# Patient Record
Sex: Male | Born: 1970 | Race: White | Hispanic: No | Marital: Married | Smoking: Never smoker
Health system: Southern US, Community
[De-identification: ages and names within clinical notes are randomized; demographics above are authoritative.]

## PROBLEM LIST (undated history)

## (undated) DIAGNOSIS — M1990 Unspecified osteoarthritis, unspecified site: Secondary | ICD-10-CM

## (undated) DIAGNOSIS — K222 Esophageal obstruction: Secondary | ICD-10-CM

## (undated) DIAGNOSIS — J329 Chronic sinusitis, unspecified: Secondary | ICD-10-CM

## (undated) DIAGNOSIS — Z8489 Family history of other specified conditions: Secondary | ICD-10-CM

## (undated) DIAGNOSIS — K5190 Ulcerative colitis, unspecified, without complications: Secondary | ICD-10-CM

## (undated) DIAGNOSIS — D839 Common variable immunodeficiency, unspecified: Secondary | ICD-10-CM

## (undated) HISTORY — DX: Unspecified osteoarthritis, unspecified site: M19.90

## (undated) HISTORY — DX: Ulcerative colitis, unspecified, without complications: K51.90

## (undated) HISTORY — DX: Common variable immunodeficiency, unspecified: D83.9

## (undated) HISTORY — DX: Chronic sinusitis, unspecified: J32.9

## (undated) HISTORY — DX: Esophageal obstruction: K22.2

---
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
## 1992-01-12 DIAGNOSIS — K5190 Ulcerative colitis, unspecified, without complications: Secondary | ICD-10-CM

## 1992-01-12 HISTORY — DX: Ulcerative colitis, unspecified, without complications: K51.90

## 2005-01-11 DIAGNOSIS — M1990 Unspecified osteoarthritis, unspecified site: Secondary | ICD-10-CM

## 2005-01-11 HISTORY — DX: Unspecified osteoarthritis, unspecified site: M19.90

## 2006-01-10 ENCOUNTER — Ambulatory Visit (HOSPITAL_COMMUNITY): Admission: RE | Admit: 2006-01-10 | Discharge: 2006-01-10 | Payer: Self-pay | Admitting: Family Medicine

## 2014-12-18 ENCOUNTER — Other Ambulatory Visit: Payer: Self-pay | Admitting: Internal Medicine

## 2014-12-18 DIAGNOSIS — D849 Immunodeficiency, unspecified: Secondary | ICD-10-CM

## 2014-12-18 DIAGNOSIS — M792 Neuralgia and neuritis, unspecified: Secondary | ICD-10-CM

## 2014-12-25 ENCOUNTER — Other Ambulatory Visit: Payer: Self-pay

## 2014-12-26 ENCOUNTER — Ambulatory Visit
Admission: RE | Admit: 2014-12-26 | Discharge: 2014-12-26 | Disposition: A | Discharge status: Home / Self Care | Payer: BLUE CROSS/BLUE SHIELD | Source: Ambulatory Visit | Attending: Internal Medicine | Admitting: Internal Medicine

## 2014-12-26 ENCOUNTER — Other Ambulatory Visit: Payer: Self-pay | Admitting: Internal Medicine

## 2014-12-26 DIAGNOSIS — D849 Immunodeficiency, unspecified: Secondary | ICD-10-CM

## 2014-12-26 DIAGNOSIS — M792 Neuralgia and neuritis, unspecified: Secondary | ICD-10-CM

## 2014-12-26 DIAGNOSIS — D899 Disorder involving the immune mechanism, unspecified: Principal | ICD-10-CM

## 2014-12-26 MED ORDER — GADOBENATE DIMEGLUMINE 529 MG/ML IV SOLN
16.0000 mL | Freq: Once | INTRAVENOUS | Status: AC | PRN
  Administered 2014-12-26: 16 mL via INTRAVENOUS

## 2015-01-21 ENCOUNTER — Ambulatory Visit: Payer: BLUE CROSS/BLUE SHIELD | Admitting: Diagnostic Neuroimaging

## 2015-01-22 ENCOUNTER — Encounter: Payer: Self-pay | Admitting: *Deleted

## 2015-01-22 ENCOUNTER — Ambulatory Visit (INDEPENDENT_AMBULATORY_CARE_PROVIDER_SITE_OTHER): Payer: BLUE CROSS/BLUE SHIELD | Admitting: Diagnostic Neuroimaging

## 2015-01-22 VITALS — BP 142/93 | HR 77 | Ht 69.0 in | Wt 170.2 lb

## 2015-01-22 DIAGNOSIS — D839 Common variable immunodeficiency, unspecified: Secondary | ICD-10-CM | POA: Diagnosis not present

## 2015-01-22 DIAGNOSIS — M5416 Radiculopathy, lumbar region: Secondary | ICD-10-CM | POA: Diagnosis not present

## 2015-01-22 DIAGNOSIS — M5414 Radiculopathy, thoracic region: Secondary | ICD-10-CM | POA: Diagnosis not present

## 2015-01-22 MED ORDER — AMITRIPTYLINE HCL 25 MG PO TABS: 25.0000 mg | ORAL_TABLET | Freq: Every day | ORAL | Status: DC

## 2015-01-22 NOTE — Patient Instructions (Signed)
Thank you for coming to see [place] at [facility]. I hope we have been able to provide you high quality care today.  You may receive a patient satisfaction survey over the next few weeks. We would appreciate your feedback and comments so that we may continue to improve ourselves and the health of our patients.  - try amitriptyline 56m at bedtime - try compounded neuropathy cream three times per day   ~~~~~~~~~~~~~~~~~~~~~~~~~~~~~~~~~~~~~~~~~~~~~~~~~~~~~~~~~~~~~~~~~  DR. PENUMALLI'S GUIDE TO HAPPY AND HEALTHY LIVING These are some of my general health and wellness recommendations. Some of them may apply to you better than others. Please use common sense as you try these suggestions and feel free to ask me any questions.   ACTIVITY/FITNESS Mental, social, emotional and physical stimulation are very important for brain and body health. Try learning a new activity (arts, music, language, sports, games).  Keep moving your body to the best of your abilities. You can do this at home, inside or outside, the park, community center, gym or anywhere you like. Consider a physical therapist or personal trainer to get started. Consider the app Sworkit. Fitness trackers such as smart-watches, smart-phones or Fitbits can help as well.   NUTRITION Eat more plants: colorful vegetables, nuts, seeds and berries.  Eat less sugar, salt, preservatives and processed foods.  Avoid toxins such as cigarettes and alcohol.  Drink water when you are thirsty. Warm water with a slice of lemon is an excellent morning drink to start the day.  Consider these websites for more information The Nutrition Source (hhttps://www.henry-hernandez.biz/ Precision Nutrition (wWindowBlog.ch   RELAXATION Consider practicing mindfulness meditation or other relaxation techniques such as deep breathing, prayer, yoga, tai chi, massage. See website mindful.org or the apps  Headspace or Calm to help get started.   SLEEP Try to get at least 7-8+ hours sleep per day. Regular exercise and reduced caffeine will help you sleep better. Practice good sleep hygeine techniques. See website sleep.org for more information.   PLANNING Prepare estate planning, living will, healthcare POA documents. Sometimes this is best planned with the help of an attorney. Theconversationproject.org and agingwithdignity.org are excellent resources.

## 2015-01-22 NOTE — Progress Notes (Signed)
[facility]  PATIENT: Eric Carson DOB: 10-12-1970  REFERRING CLINICIAN: Dione Housekeeper, MD HISTORY FROM: patient and wife  REASON FOR VISIT: new consult    HISTORICAL  CHIEF COMPLAINT:  Chief Complaint  Patient presents with  . Numbness    rm 7, New pt,  wife- Sharyn Lull, "waves of pins/needles right side" since 11/11/14    HISTORY OF PRESENT ILLNESS:   45 year old left-handed male with common variable immunodeficiency diagnosed age 65 years old, on IVIG therapy since age 59 years old, ankylosing spondylitis, ulcerative colitis, here for evaluation of right thoracic/flank numbness and right leg numbness.  11/11/2014 patient noticed onset of pain radiating from his mid thoracic region posteriorly, radiating circumferentially around his right flank and anteriorly into his right anterior costal margin. He describes a 1-2 inch band of pain, burning, pins and needles and numbness. Patient noted no specific rash or lesions. No prodromal trauma or accidents.  Patient went to PCP and was prescribed Valtrex. However the next day symptoms improved and therefore patient did not take Valtrex. Symptoms fluctuated over the next few days.  In November patient was prescribed course of prednisone. Symptoms improved slightly but continued.  Now patient continues to have intermittent waves of pressure, numbness and tingling sensation in the right thoracic dermatomal region, as well as radiating symptoms in the right leg.  Patient had MRI scans of the brain, thoracic and lumbar spine which were unremarkable.  Symptoms continue today. Symptoms have been aggravated by movement. Symptoms are affecting patient's quality of life and sleep and mood.    REVIEW OF SYSTEMS: Full 14 system review of systems performed and notable only for weight loss fatigue cough numbness not enough sleep running nose skin sensitivity insomnia.  ALLERGIES: Allergies  Allergen Reactions  . Penicillins  Hives    As child    HOME MEDICATIONS: No outpatient prescriptions prior to visit.   No facility-administered medications prior to visit.    PAST MEDICAL HISTORY: Past Medical History  Diagnosis Date  . CVID (common variable immunodeficiency) ([place]) age 40    tx at 65- IVIG since age 89  . Inflammatory arthritis ([place]) 2007  . Ulcerative colitis ([place]) 1994  . Sinusitis     recurrent    PAST SURGICAL HISTORY: Past Surgical History  Procedure Laterality Date  . Lymph node dissection  1983  . Wisdom tooth extraction  1989    FAMILY HISTORY: Family History  Problem Relation Age of Onset  . Lymphoma Mother   . Arthritis Mother   . Lupus Paternal Aunt   . Cancer - Colon Maternal Grandmother   . Alzheimer's disease Paternal Grandfather     SOCIAL HISTORY:  Social History   Social History  . Marital Status: Married    Spouse Name: Sharyn Lull  . Number of Children: 2  . Years of Education: 16   Occupational History  .      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 Smoking status: Never Smoker   . Smokeless tobacco: Never Used  . Alcohol Use: 0.0 oz/week    0 Standard drinks or equivalent per week     Comment: social  < 1 day  . Drug Use: No  . Sexual Activity: Not on file   Other Topics Concern  . Not on file   Social History Narrative   Lives at home with wife, children   Caffeine use- soda 5 weekly     PHYSICAL EXAM  GENERAL EXAM/CONSTITUTIONAL: Vitals:  Filed Vitals:   01/22/15 1015  BP: 142/93  Pulse: 77  Height: 5' 9"  (1.753 m)  Weight: 170 lb 3.2 oz (77.202 kg)     Body mass index is 25.12 kg/(m^2).  Visual Acuity Screening   Right eye Left eye Both eyes  Without correction: 20/30 20/50   With correction:        Patient is in no distress; well developed, nourished and groomed; neck is supple  CARDIOVASCULAR:  Examination of carotid arteries is normal; no carotid bruits  Regular rate and rhythm, no  murmurs  Examination of peripheral vascular system by observation and palpation is normal  EYES:  Ophthalmoscopic exam of optic discs and posterior segments is normal; no papilledema or hemorrhages  MUSCULOSKELETAL:  Gait, strength, tone, movements noted in Neurologic exam below  NEUROLOGIC: MENTAL STATUS:  No flowsheet data found.  awake, alert, oriented to person, place and time  recent and remote memory intact  normal attention and concentration  language fluent, comprehension intact, naming intact,   fund of knowledge appropriate  CRANIAL NERVE:   2nd - no papilledema on fundoscopic exam  2nd, 3rd, 4th, 6th - pupils equal and reactive to light, visual fields full to confrontation, extraocular muscles intact, no nystagmus  5th - facial sensation symmetric  7th - facial strength symmetric  8th - hearing intact  9th - palate elevates symmetrically, uvula midline  11th - shoulder shrug symmetric  12th - tongue protrusion midline  MOTOR:   normal bulk and tone, full strength in the BUE, BLE  SENSORY:   normal and symmetric to light touch, pinprick, temperature, vibration; EXCEPT DECR PP IN RIGHT THORACIC DERMATOMES T5-T7  COORDINATION:   finger-nose-finger, fine finger movements, heel-shin normal  REFLEXES:   deep tendon reflexes present and symmetric  GAIT/STATION:   narrow based gait; able to walk on toes, heels and tandem; romberg is negative    DIAGNOSTIC DATA (LABS, IMAGING, TESTING) - I reviewed patient records, labs, notes, testing and imaging myself where available.  No results found for: WBC, HGB, HCT, MCV, PLT No results found for: NA, K, CL, CO2, GLUCOSE, BUN, CREATININE, CALCIUM, PROT, ALBUMIN, AST, ALT, ALKPHOS, BILITOT, GFRNONAA, GFRAA No results found for: CHOL, HDL, LDLCALC, LDLDIRECT, TRIG, CHOLHDL No results found for: HGBA1C No results found for: VITAMINB12 No results found for: TSH   [I reviewed images myself and agree  with interpretation. -VRP]   12/26/14 MRI brain (with and without)  - negative  12/26/14 MRI thoracic spine (without)  - No thoracic disc herniation or stenosis.  12/26/14 MRI lumbar spine (without)  - Mild lower lumbar facet hypertrophy. No disc herniation or stenosis.     ASSESSMENT AND PLAN  45 y.o. year old male here with history of common variable immunodeficiency, now with abnormal sensation in the right thoracic dermatomal distribution T5-T7, with additional symptoms in the right leg. Symptoms reported with neuropathic features including burning, pins and needles, tingling. No specific triggering or associated factors.   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 Other metabolic, autoimmune, inflammatory or post viral syndromes also possible. No evidence for mechanical, structural or compressive etiology. No further imaging studies advised. Will check laboratory testing to screen for other causes of neuropathy/radiculopathy. Will also try symptom control with amitriptyline and topical agent.   Ddx: zoster sine herpete, metaboic, autoimmune/inflamm, post-viral  Thoracic radiculopathy - Plan: Vitamin B12, Hemoglobin A1c, TSH  Lumbar radiculopathy -  Plan: Vitamin B12, Hemoglobin A1c, TSH  CVID (common variable immunodeficiency) ([place]) - Plan: Vitamin B12, Hemoglobin A1c, TSH    PLAN: - try amitriptyline 22m at bedtime - try compounded neuropathy cream three times per day  Orders Placed This Encounter  Procedures  . Vitamin B12  . Hemoglobin A1c  . TSH   Meds ordered this encounter  Medications  . amitriptyline (ELAVIL) 25 MG tablet    Sig: Take 1 tablet (25 mg total) by mouth at bedtime.    Dispense:  30 tablet    Refill:  3   Return in about 6 weeks (around 03/05/2015).    VPenni Bombard MD 14/83/5075 173:22AM Certified in  Neurology, Neurophysiology and Neuroimaging  [facility] [address] [place] [phone]

## 2015-01-23 ENCOUNTER — Telehealth: Payer: Self-pay | Admitting: *Deleted

## 2015-01-23 LAB — VITAMIN B12: Vitamin B-12: 1011 pg/mL — ABNORMAL HIGH (ref 211–946)

## 2015-01-23 LAB — HEMOGLOBIN A1C
Est. average glucose Bld gHb Est-mCnc: 123 mg/dL
HEMOGLOBIN A1C: 5.9 % — AB (ref 4.8–5.6)

## 2015-01-23 LAB — TSH: TSH: 3.3 u[IU]/mL (ref 0.450–4.500)

## 2015-01-23 NOTE — Telephone Encounter (Signed)
LVM informing patient that his lab results are unremarkable. Left this caller's name, number for any questions.

## 2015-01-24 ENCOUNTER — Telehealth: Payer: Self-pay | Admitting: Diagnostic Neuroimaging

## 2015-01-24 MED ORDER — GABAPENTIN 250 MG/5ML PO SOLN: 100.0000 mg | Freq: Every day | ORAL | Status: DC

## 2015-01-24 NOTE — Telephone Encounter (Signed)
Sending gabapentin suspension. -VRP

## 2015-01-24 NOTE — Telephone Encounter (Signed)
Pt called and says the his insurance will not cover any nerve cream. He would like to know if he could have a rx for Gabapentin oral solution. Please send to [facility] on [address]

## 2015-01-24 NOTE — Telephone Encounter (Signed)
LVM for patient informing him Dr Leta Baptist is e scribing gabapentin oral sol'n to his pharmacy. Left this caller's name, number for questions.

## 2015-01-31 ENCOUNTER — Ambulatory Visit: Payer: Self-pay | Admitting: Diagnostic Neuroimaging

## 2015-03-06 ENCOUNTER — Ambulatory Visit (INDEPENDENT_AMBULATORY_CARE_PROVIDER_SITE_OTHER): Payer: BLUE CROSS/BLUE SHIELD | Admitting: Diagnostic Neuroimaging

## 2015-03-06 ENCOUNTER — Encounter: Payer: Self-pay | Admitting: Diagnostic Neuroimaging

## 2015-03-06 VITALS — BP 141/94 | HR 81 | Ht 69.0 in | Wt 174.8 lb

## 2015-03-06 DIAGNOSIS — M5414 Radiculopathy, thoracic region: Secondary | ICD-10-CM

## 2015-03-06 DIAGNOSIS — D839 Common variable immunodeficiency, unspecified: Secondary | ICD-10-CM | POA: Diagnosis not present

## 2015-03-06 MED ORDER — GABAPENTIN 250 MG/5ML PO SOLN: 100.0000 mg | Freq: Every day | ORAL | Status: DC

## 2015-03-06 MED ORDER — AMITRIPTYLINE HCL 25 MG PO TABS: 25.0000 mg | ORAL_TABLET | Freq: Every day | ORAL | Status: DC

## 2015-03-06 NOTE — Progress Notes (Signed)
[facility]  PATIENT: Eric Carson DOB: July 03, 1970  REFERRING CLINICIAN: Dione Housekeeper, MD HISTORY FROM: patient and wife  REASON FOR VISIT: follow up   HISTORICAL  CHIEF COMPLAINT:  Chief Complaint  Patient presents with  . Thoracic radiculopathy    rm 6, wife- Sharyn Lull, "movement triggers pain; overall improved"  . Follow-up    6 week    HISTORY OF PRESENT ILLNESS:   UPDATE 03/06/15: Since last visit, sxs are slightly better with amitriptyline and gabapentin. Compounded cream not covered by insurance. Able to sleep better now.   PRIOR HPI (01/12/15): 45 year old left-handed male with common variable immunodeficiency diagnosed age 45 years old, on IVIG therapy since age 45 years old, ankylosing spondylitis, ulcerative colitis, here for evaluation of right thoracic/flank numbness and right leg numbness. 11/11/2014 patient noticed onset of pain radiating from his mid thoracic region posteriorly, radiating circumferentially around his right flank and anteriorly into his right anterior costal margin. He describes a 1-2 inch band of pain, burning, pins and needles and numbness. Patient noted no specific rash or lesions. No prodromal trauma or accidents. Patient went to PCP and was prescribed Valtrex. However the next day symptoms improved and therefore patient did not take Valtrex. Symptoms fluctuated over the next few days. In November patient was prescribed course of prednisone. Symptoms improved slightly but continued. Now patient continues to have intermittent waves of pressure, numbness and tingling sensation in the right thoracic dermatomal region, as well as radiating symptoms in the right leg. Patient had MRI scans of the brain, thoracic and lumbar spine which were unremarkable. Symptoms continue today. Symptoms have been aggravated by movement. Symptoms are affecting patient's quality of life and sleep and mood.   REVIEW OF SYSTEMS: Full 14 system review of systems  performed and notable only for eye discharge numbness cough numbness.    ALLERGIES: Allergies  Allergen Reactions  . Penicillins Hives    As child    HOME MEDICATIONS: Outpatient Prescriptions Prior to Visit  Medication Sig Dispense Refill  . Adalimumab (HUMIRA PEN) 40 MG/0.8ML PNKT Inject into the skin. Once every 2 weeks    . folic acid (FOLVITE) 1 MG tablet Take 1 mg by mouth.    . methotrexate (RHEUMATREX) 2.5 MG tablet 2.5 mg. weekly    . metroNIDAZOLE (METROGEL) 1 % gel Apply topically.    . mometasone (NASONEX) 50 MCG/ACT nasal spray Place into the nose.    . Multiple Vitamins tablet Take by mouth.    . nystatin (MYCOSTATIN) 100000 UNIT/ML suspension Swish and Swallow 51m 4 times per day as needed    . amitriptyline (ELAVIL) 25 MG tablet Take 1 tablet (25 mg total) by mouth at bedtime. 30 tablet 3  . gabapentin (NEURONTIN) 250 MG/5ML solution Take 2-6 mLs (100-300 mg total) by mouth at bedtime. Start 1064mat bedtime; gradually increase up to 30034mt bedtime 200 mL 6  . NONFORMULARY OR COMPOUNDED ITEM 01/22/15 Acyclovir 5%, Amitriptyline 2%, DDG 0.1%, Ketamine 10%, Ketoprofen 5%, Lidocaine 5%     No facility-administered medications prior to visit.    PAST MEDICAL HISTORY: Past Medical History  Diagnosis Date  . CVID (common variable immunodeficiency) ([facility] tx at [place]VIG since age [age] Inflammatory arthritis ([facility] [place]  . Ulcerative colitis ([facility]  . Sinusitis     recurrent    PAST SURGICAL HISTORY: Past Surgical History  Procedure Laterality Date  . Lymph node dissection  1983  .  Wisdom tooth extraction  1989    FAMILY HISTORY: Family History  Problem Relation Age of Onset  . Lymphoma Mother   . Arthritis Mother   . Lupus Paternal Aunt   . Cancer - Colon Maternal Grandmother   . Alzheimer's disease Paternal Grandfather     SOCIAL HISTORY:  Social History   Social History  . Marital Status: Married    Spouse Name: Sharyn Lull  .  Number of Children: 2  . Years of Education: 16   Occupational History  .      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 Smoking status: Never Smoker   . Smokeless tobacco: Never Used  . Alcohol Use: 0.0 oz/week    0 Standard drinks or equivalent per week     Comment: social  < 1 day  . Drug Use: No  . Sexual Activity: Not on file   Other Topics Concern  . Not on file   Social History Narrative   Lives at home with wife, children   Caffeine use- soda 5 weekly     PHYSICAL EXAM  GENERAL EXAM/CONSTITUTIONAL: Vitals:  Filed Vitals:   03/06/15 1543  BP: 141/94  Pulse: 81  Height: 5' 9"  (1.753 m)  Weight: 174 lb 12.8 oz (79.289 kg)   Body mass index is 25.8 kg/(m^2). No exam data present  Patient is in no distress; well developed, nourished and groomed; neck is supple  CARDIOVASCULAR:  Examination of carotid arteries is normal; no carotid bruits  Regular rate and rhythm, no murmurs  Examination of peripheral vascular system by observation and palpation is normal  EYES:  Ophthalmoscopic exam of optic discs and posterior segments is normal; no papilledema or hemorrhages  MUSCULOSKELETAL:  Gait, strength, tone, movements noted in Neurologic exam below  NEUROLOGIC: MENTAL STATUS:  No flowsheet data found.  awake, alert, oriented to person, place and time  recent and remote memory intact  normal attention and concentration  language fluent, comprehension intact, naming intact,   fund of knowledge appropriate  CRANIAL NERVE:   2nd, 3rd, 4th, 6th - pupils equal and reactive to light, visual fields full to confrontation, extraocular muscles intact, no nystagmus  5th - facial sensation symmetric  7th - facial strength symmetric  8th - hearing intact  9th - palate elevates symmetrically, uvula midline  11th - shoulder shrug symmetric  12th - tongue protrusion midline  MOTOR:   normal bulk and tone, full strength in the BUE,  BLE  SENSORY:   normal and symmetric to light touch, pinprick, temperature, vibration; MILD ALLODYNIA IN RIGHT COSTAL MARGIN  COORDINATION:   finger-nose-finger, fine finger movements, heel-shin normal  REFLEXES:   deep tendon reflexes present and symmetric  GAIT/STATION:   narrow based gait; able to walk on toes, heels and tandem; romberg is negative    DIAGNOSTIC DATA (LABS, IMAGING, TESTING) - I reviewed patient records, labs, notes, testing and imaging myself where available.  No results found for: WBC, HGB, HCT, MCV, PLT No results found for: NA, K, CL, CO2, GLUCOSE, BUN, CREATININE, CALCIUM, PROT, ALBUMIN, AST, ALT, ALKPHOS, BILITOT, GFRNONAA, GFRAA No results found for: CHOL, HDL, LDLCALC, LDLDIRECT, TRIG, CHOLHDL Lab Results  Component Value Date   HGBA1C 5.9* 01/22/2015   Lab Results  Component Value Date   VITAMINB12 1011* 01/22/2015   Lab Results  Component Value Date   TSH 3.300 01/22/2015     [I reviewed images myself and agree with interpretation. -VRP]   12/26/14 MRI  brain (with and without)  - negative  12/26/14 MRI thoracic spine (without)  - No thoracic disc herniation or stenosis.  12/26/14 MRI lumbar spine (without)  - Mild lower lumbar facet hypertrophy. No disc herniation or stenosis.     ASSESSMENT AND PLAN  45 y.o. year old male here with history of common variable immunodeficiency, now with abnormal sensation in the right thoracic dermatomal distribution T5-T7, with additional symptoms in the right leg. Symptoms reported with neuropathic features including burning, pins and needles, tingling. No specific triggering or associated factors.   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 Other metabolic, autoimmune, inflammatory or post viral syndromes also possible. No evidence for mechanical, structural or  compressive etiology. No further imaging studies advised.    Ddx: zoster sine herpete vs other post-viral phenomenon  Thoracic radiculopathy  CVID (common variable immunodeficiency) ([facility])    PLAN: - may increase amitriptyline to 26m at bedtime - may increase gabapentin up to 500-6030mat bedtime  Meds ordered this encounter  Medications  . gabapentin (NEURONTIN) 250 MG/5ML solution    Sig: Take 2-6 mLs (100-300 mg total) by mouth at bedtime. Start 10059mt bedtime; gradually increase up to 300m14m bedtime    Dispense:  400 mL    Refill:  12  . amitriptyline (ELAVIL) 25 MG tablet    Sig: Take 1 tablet (25 mg total) by mouth at bedtime.    Dispense:  90 tablet    Refill:  4   Return in about 4 months (around 07/04/2015).    VIKRPenni Bombard 2/[id number] in Neurology, Neurophysiology and Neuroimaging  [facility] [address] [phone]

## 2015-03-06 NOTE — Patient Instructions (Signed)
-   may increase amitriptyline to 72m at bedtime - may increase gabapentin up to 500-6045mat bedtime

## 2015-03-14 ENCOUNTER — Ambulatory Visit: Payer: BLUE CROSS/BLUE SHIELD | Admitting: Diagnostic Neuroimaging

## 2015-07-07 ENCOUNTER — Encounter: Payer: Self-pay | Admitting: Diagnostic Neuroimaging

## 2015-07-07 ENCOUNTER — Ambulatory Visit (INDEPENDENT_AMBULATORY_CARE_PROVIDER_SITE_OTHER): Payer: BLUE CROSS/BLUE SHIELD | Admitting: Diagnostic Neuroimaging

## 2015-07-07 VITALS — BP 175/87 | HR 79 | Ht 69.0 in | Wt 180.8 lb

## 2015-07-07 DIAGNOSIS — M5416 Radiculopathy, lumbar region: Secondary | ICD-10-CM | POA: Diagnosis not present

## 2015-07-07 DIAGNOSIS — M5414 Radiculopathy, thoracic region: Secondary | ICD-10-CM | POA: Diagnosis not present

## 2015-07-07 DIAGNOSIS — D839 Common variable immunodeficiency, unspecified: Secondary | ICD-10-CM | POA: Diagnosis not present

## 2015-07-07 NOTE — Patient Instructions (Signed)
-   monitor symptoms

## 2015-07-07 NOTE — Progress Notes (Signed)
[facility]  PATIENT: Eric Carson DOB: 1970/02/27  REFERRING CLINICIAN: Dione Housekeeper, MD HISTORY FROM: patient REASON FOR VISIT: follow up   HISTORICAL  CHIEF COMPLAINT:  Chief Complaint  Patient presents with  . Thoracic radiculopathy    rm 6, "pain 95 % better, no longer tkaing Elavil or Gabapentin"  . Follow-up    4 months    HISTORY OF PRESENT ILLNESS:   UPDATE 07/07/15: Since last visit, doing better. 95-98% better. Now off medications (gabapentin and amitriptyline).   UPDATE 03/06/15: Since last visit, sxs are slightly better with amitriptyline and gabapentin. Compounded cream not covered by insurance. Able to sleep better now.   PRIOR HPI (01/12/15): 45 year old left-handed male with common variable immunodeficiency diagnosed age 45 years old, on IVIG therapy since age 45 years old, ankylosing spondylitis, ulcerative colitis, here for evaluation of right thoracic/flank numbness and right leg numbness. 11/11/2014 patient noticed onset of pain radiating from his mid thoracic region posteriorly, radiating circumferentially around his right flank and anteriorly into his right anterior costal margin. He describes a 1-2 inch band of pain, burning, pins and needles and numbness. Patient noted no specific rash or lesions. No prodromal trauma or accidents. Patient went to PCP and was prescribed Valtrex. However the next day symptoms improved and therefore patient did not take Valtrex. Symptoms fluctuated over the next few days. In November patient was prescribed course of prednisone. Symptoms improved slightly but continued. Now patient continues to have intermittent waves of pressure, numbness and tingling sensation in the right thoracic dermatomal region, as well as radiating symptoms in the right leg. Patient had MRI scans of the brain, thoracic and lumbar spine which were unremarkable. Symptoms continue today. Symptoms have been aggravated by movement. Symptoms are  affecting patient's quality of life and sleep and mood.   REVIEW OF SYSTEMS: Full 14 system review of systems performed and notable only for eye discharge numbness cough numbness.    ALLERGIES: Allergies  Allergen Reactions  . Penicillins Hives    As child    HOME MEDICATIONS: Outpatient Prescriptions Prior to Visit  Medication Sig Dispense Refill  . Adalimumab (HUMIRA PEN) 40 MG/0.8ML PNKT Inject into the skin. Once every 2 weeks    . folic acid (FOLVITE) 1 MG tablet Take 1 mg by mouth.    . methotrexate (RHEUMATREX) 2.5 MG tablet 2.5 mg. weekly    . metroNIDAZOLE (METROGEL) 1 % gel Apply topically.    . mometasone (NASONEX) 50 MCG/ACT nasal spray Place into the nose.    . Multiple Vitamins tablet Take by mouth.    . nystatin (MYCOSTATIN) 100000 UNIT/ML suspension Swish and Swallow 53m 4 times per day as needed    . amitriptyline (ELAVIL) 25 MG tablet Take 1 tablet (25 mg total) by mouth at bedtime. (Patient not taking: Reported on 07/07/2015) 90 tablet 4  . gabapentin (NEURONTIN) 250 MG/5ML solution Take 2-6 mLs (100-300 mg total) by mouth at bedtime. Start 1030mat bedtime; gradually increase up to 30087mt bedtime (Patient not taking: Reported on 07/07/2015) 400 mL 12   No facility-administered medications prior to visit.    PAST MEDICAL HISTORY: Past Medical History  Diagnosis Date  . CVID (common variable immunodeficiency) ([facility] [facility] [place] [facility] [id number] since age 45 47 Inflammatory arthritis ([place]  . Ulcerative colitis ([place]  . Sinusitis     recurrent    PAST SURGICAL HISTORY: Past Surgical History  Procedure Laterality Date  .  Lymph node dissection  1983  . Wisdom tooth extraction  1989    FAMILY HISTORY: Family History  Problem Relation Age of Onset  . Lymphoma Mother   . Arthritis Mother   . Lupus Paternal Aunt   . Cancer - Colon Maternal Grandmother   . Alzheimer's disease Paternal Grandfather     SOCIAL HISTORY:  Social History    Social History  . Marital Status: Married    Spouse Name: Sharyn Lull  . Number of Children: 2  . Years of Education: 16   Occupational History  .      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 Smoking status: Never Smoker   . Smokeless tobacco: Never Used  . Alcohol Use: 0.0 oz/week    0 Standard drinks or equivalent per week     Comment: social  < 1 day  . Drug Use: No  . Sexual Activity: Not on file   Other Topics Concern  . Not on file   Social History Narrative   Lives at home with wife, children   Caffeine use- soda 5 weekly     PHYSICAL EXAM  GENERAL EXAM/CONSTITUTIONAL: Vitals:  Filed Vitals:   07/07/15 1524  BP: 175/87  Pulse: 79  Height: 5' 9"  (1.753 m)  Weight: 180 lb 12.8 oz (82.01 kg)   Body mass index is 26.69 kg/(m^2). No exam data present  Patient is in no distress; well developed, nourished and groomed; neck is supple  CARDIOVASCULAR:  Examination of carotid arteries is normal; no carotid bruits  Regular rate and rhythm, no murmurs  Examination of peripheral vascular system by observation and palpation is normal  EYES:  Ophthalmoscopic exam of optic discs and posterior segments is normal; no papilledema or hemorrhages  MUSCULOSKELETAL:  Gait, strength, tone, movements noted in Neurologic exam below  NEUROLOGIC: MENTAL STATUS:  No flowsheet data found.  awake, alert, oriented to person, place and time  recent and remote memory intact  normal attention and concentration  language fluent, comprehension intact, naming intact,   fund of knowledge appropriate  CRANIAL NERVE:   2nd, 3rd, 4th, 6th - pupils equal and reactive to light, visual fields full to confrontation, extraocular muscles intact, no nystagmus  5th - facial sensation symmetric  7th - facial strength symmetric  8th - hearing intact  9th - palate elevates symmetrically, uvula midline  11th - shoulder shrug symmetric  12th - tongue protrusion  midline  MOTOR:   normal bulk and tone, full strength in the BUE, BLE  SENSORY:   normal and symmetric to light touch  COORDINATION:   finger-nose-finger, fine finger movements normal  REFLEXES:   deep tendon reflexes present and symmetric  GAIT/STATION:   narrow based gait    DIAGNOSTIC DATA (LABS, IMAGING, TESTING) - I reviewed patient records, labs, notes, testing and imaging myself where available.  No results found for: WBC, HGB, HCT, MCV, PLT No results found for: NA, K, CL, CO2, GLUCOSE, BUN, CREATININE, CALCIUM, PROT, ALBUMIN, AST, ALT, ALKPHOS, BILITOT, GFRNONAA, GFRAA No results found for: CHOL, HDL, LDLCALC, LDLDIRECT, TRIG, CHOLHDL Lab Results  Component Value Date   HGBA1C 5.9* 01/22/2015   Lab Results  Component Value Date   VITAMINB12 1011* 01/22/2015   Lab Results  Component Value Date   TSH 3.300 01/22/2015     [I reviewed images myself and agree with interpretation. -VRP]   12/26/14 MRI brain (with and without)  - negative  12/26/14 MRI thoracic spine (without)  -  No thoracic disc herniation or stenosis.  12/26/14 MRI lumbar spine (without)  - Mild lower lumbar facet hypertrophy. No disc herniation or stenosis.     ASSESSMENT AND PLAN  45 y.o. year old male here with history of common variable immunodeficiency, now with abnormal sensation in the right thoracic dermatomal distribution T5-T7, with additional symptoms in the right leg. Symptoms reported with neuropathic features including burning, pins and needles, tingling. No specific triggering or associated factors.   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 Other metabolic, autoimmune, inflammatory or post viral syndromes also possible. No evidence for mechanical, structural or compressive etiology. No further imaging studies advised.    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    Penni Bombard, MD 05/28/9840, 1:03 PM Certified in Neurology, Neurophysiology and Neuroimaging  [facility] [address] [place], [place] [address] [phone]

## 2017-09-24 IMAGING — MR MR LUMBAR SPINE W/O CM
4 of 5 series · 26 of 48 positions shown · non-contrast
Comparison: None.

CLINICAL DATA: Neuralgia. Low back pain and numbness on the right
side for 1 month. Symptoms now radiate into the legs.

EXAM:
MRI LUMBAR SPINE WITHOUT CONTRAST
TECHNIQUE: Multiplanar, multisequence MR imaging of the lumbar spine was
performed. No intravenous contrast was administered.

[Series 4: T1 · sagittal · 4.0mm · 0.55mm/px · 6 of 12 slices shown (1 of 2)]
[im 1/12]
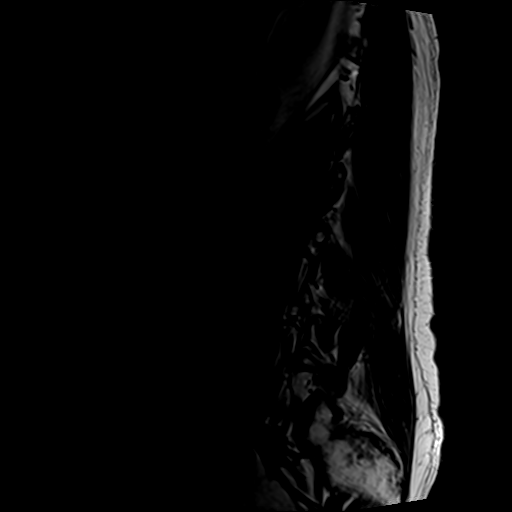
[im 3/12]
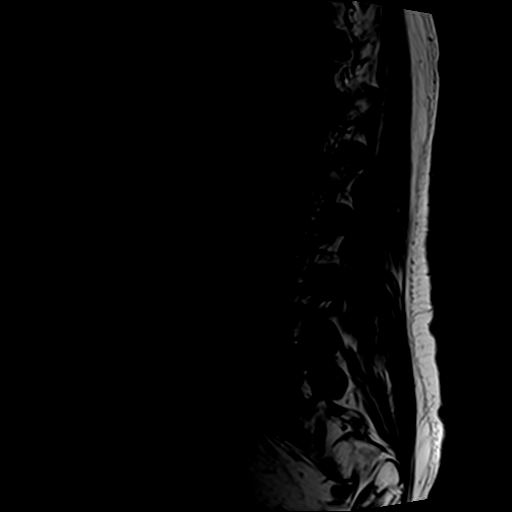
[im 5/12]
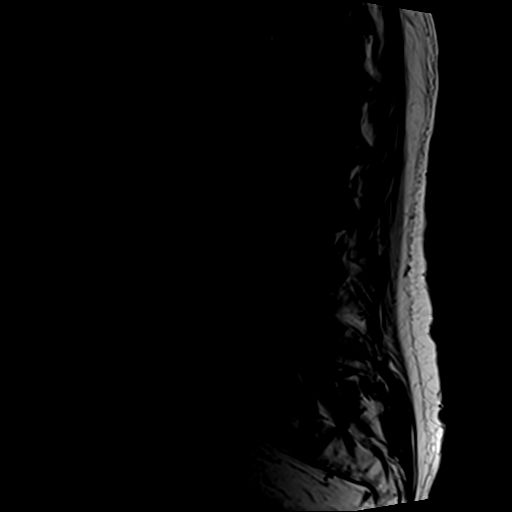
[im 7/12]
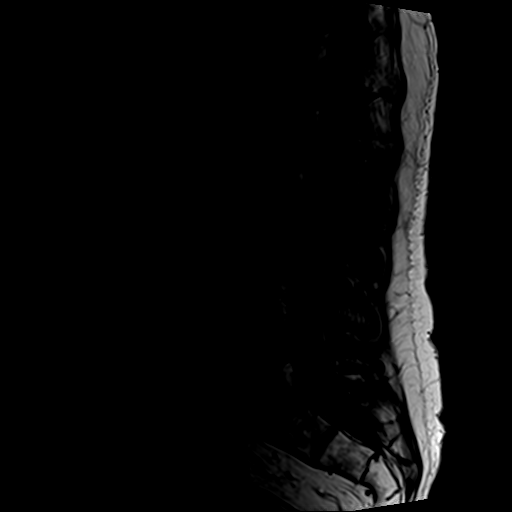
[im 9/12]
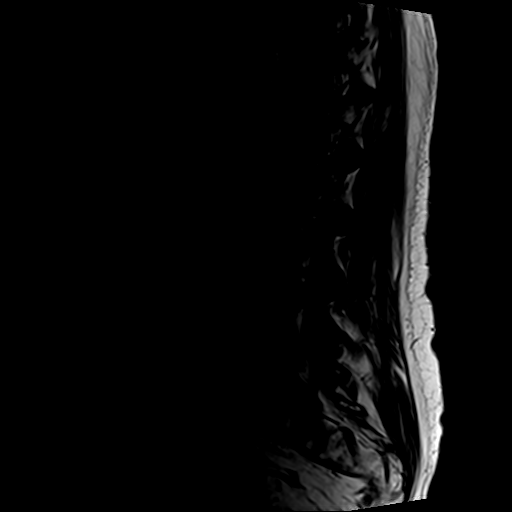
[im 12/12]
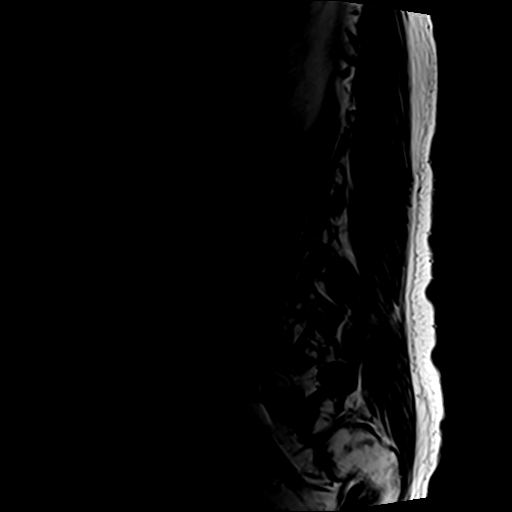

[Series 5: T2 post-contrast · sagittal · 4.0mm · 0.55mm/px · 6 of 12 slices shown]
[im 1/12]
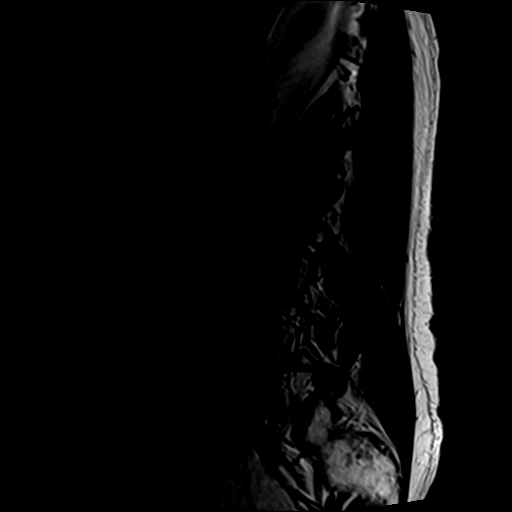
[im 3/12]
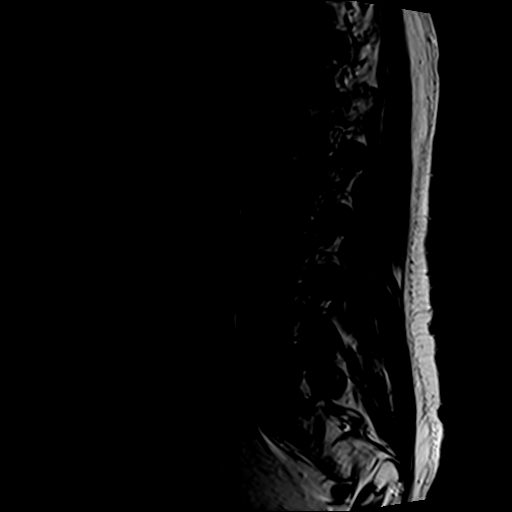
[im 5/12]
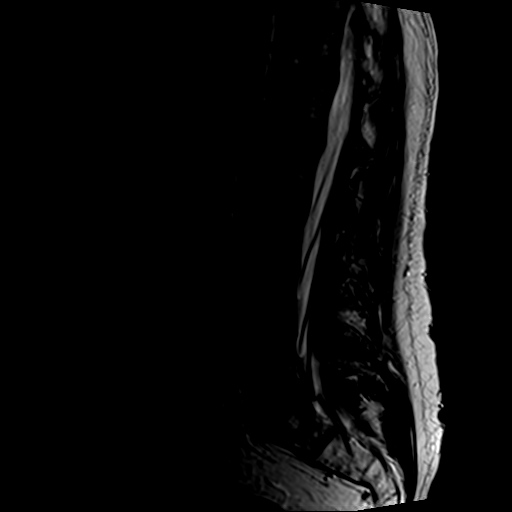
[im 7/12]
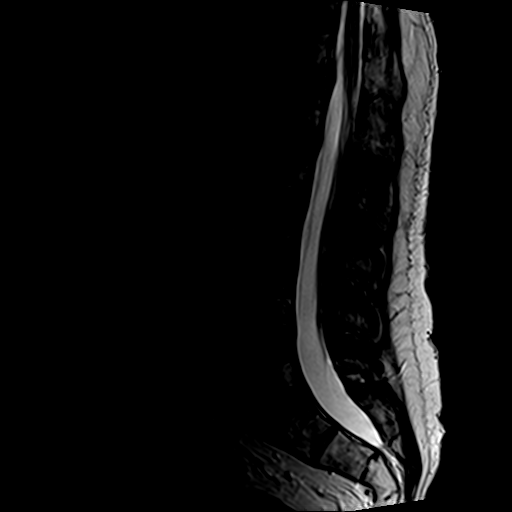
[im 9/12]
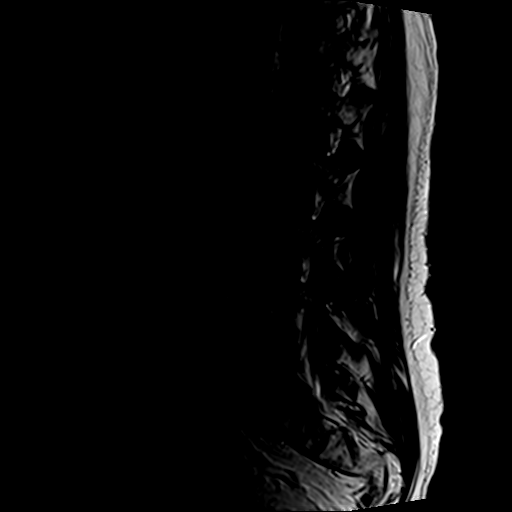
[im 12/12]
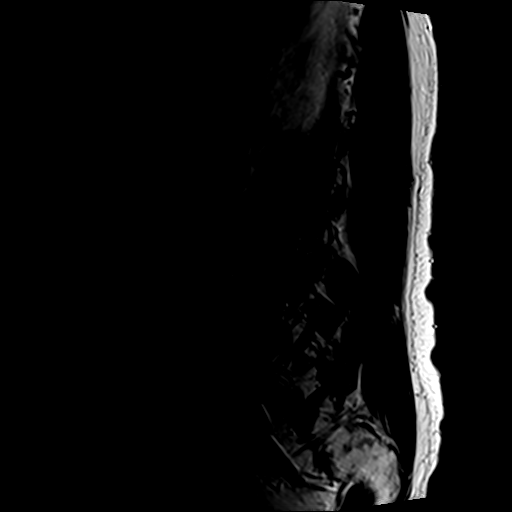

[Series 6: T2 · axial · 4.0mm · 0.70mm/px · z∈[-110,+68]mm · 9 of 32 slices shown]
[im 1/32]
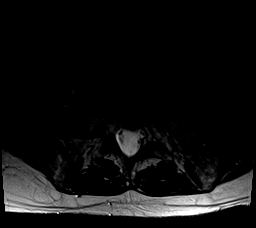
[im 5/32]
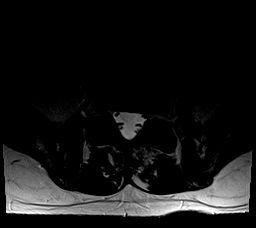
[im 9/32]
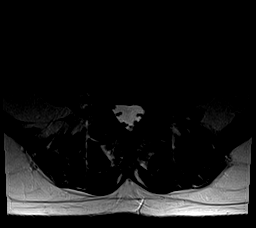
[im 14/32]
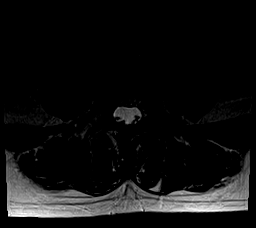
[im 16/32]
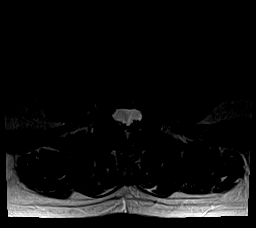
[im 18/32]
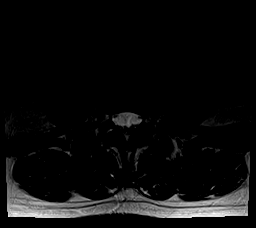
[im 23/32]
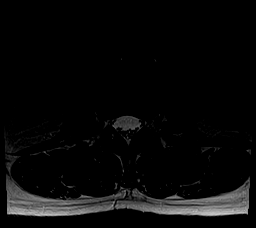
[im 27/32]
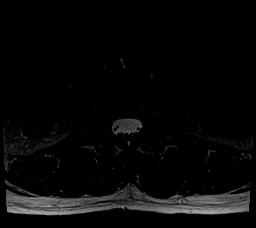
[im 32/32]
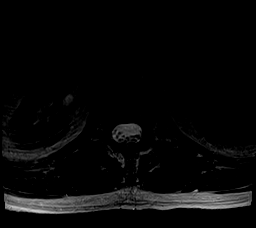

[Series 8: T1 · axial · 4.0mm · 0.35mm/px · z∈[-110,+44]mm · 5 of 32 slices shown (2 of 2)]
[im 1/32]
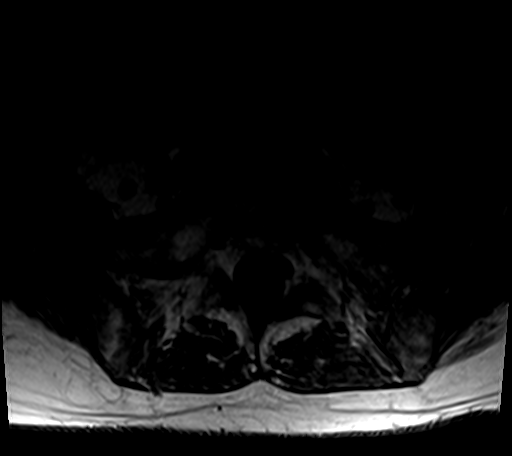
[im 5/32]
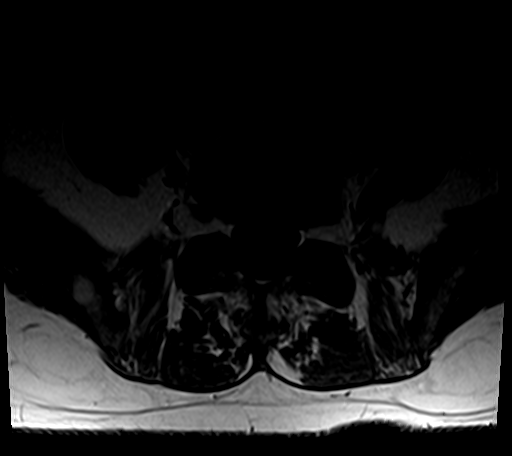
[im 9/32]
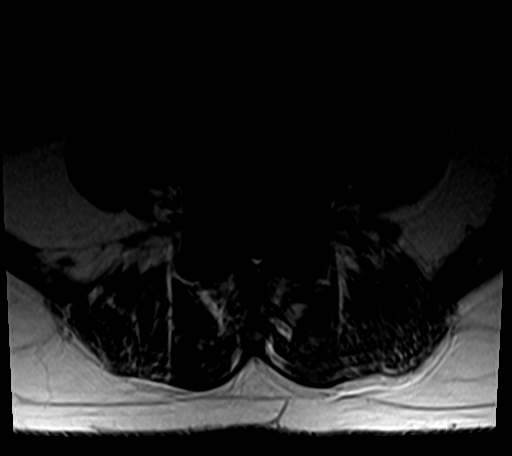
[im 16/32]
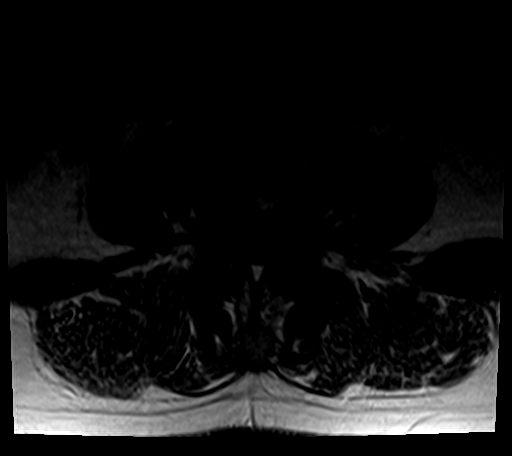
[im 27/32]
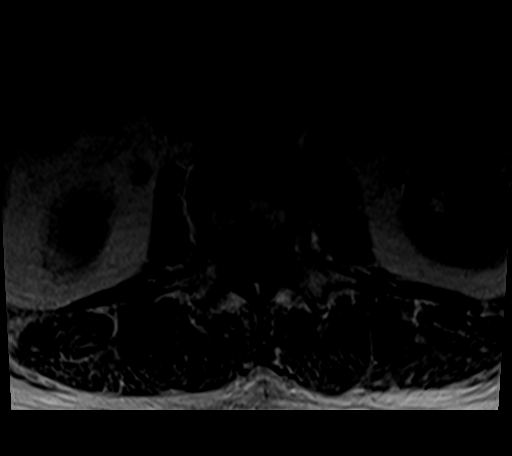

[26 of 48 positions shown; findings below may reference images not displayed]

FINDINGS: Vertebral alignment is normal. Vertebral body heights are preserved.
Bone marrow signal is diffusely heterogeneous, nonspecific. No
destructive osseous lesion or significant vertebral marrow edema is
identified. There is disc desiccation predominantly at L2-3, L4-5,
and L5-S1. Minimal disc space height loss is noted at L4-5. Conus
medullaris terminates at T12-L1. Paraspinal soft tissues are
unremarkable.

There is mild bilateral facet hypertrophy from L3-4 to L5-S1. No
disc herniation, spinal stenosis, or neural foraminal stenosis is
identified.
IMPRESSION: Mild lower lumbar facet hypertrophy. No disc herniation or stenosis.

## 2019-01-16 DIAGNOSIS — J849 Interstitial pulmonary disease, unspecified: Secondary | ICD-10-CM | POA: Diagnosis not present

## 2019-01-16 DIAGNOSIS — R599 Enlarged lymph nodes, unspecified: Secondary | ICD-10-CM | POA: Diagnosis not present

## 2019-01-16 DIAGNOSIS — D839 Common variable immunodeficiency, unspecified: Secondary | ICD-10-CM | POA: Diagnosis not present

## 2019-06-05 DIAGNOSIS — R599 Enlarged lymph nodes, unspecified: Secondary | ICD-10-CM | POA: Diagnosis not present

## 2019-06-05 DIAGNOSIS — R918 Other nonspecific abnormal finding of lung field: Secondary | ICD-10-CM | POA: Diagnosis not present

## 2019-06-05 DIAGNOSIS — C884 Extranodal marginal zone B-cell lymphoma of mucosa-associated lymphoid tissue [MALT-lymphoma]: Secondary | ICD-10-CM | POA: Diagnosis not present

## 2019-06-06 DIAGNOSIS — Z23 Encounter for immunization: Secondary | ICD-10-CM | POA: Diagnosis not present

## 2019-07-03 DIAGNOSIS — Z23 Encounter for immunization: Secondary | ICD-10-CM | POA: Diagnosis not present

## 2019-09-14 DIAGNOSIS — Z23 Encounter for immunization: Secondary | ICD-10-CM | POA: Diagnosis not present

## 2020-01-24 ENCOUNTER — Telehealth: Payer: Self-pay | Admitting: Family

## 2020-01-24 ENCOUNTER — Other Ambulatory Visit: Payer: Self-pay | Admitting: Family

## 2020-01-24 DIAGNOSIS — R599 Enlarged lymph nodes, unspecified: Secondary | ICD-10-CM

## 2020-01-24 DIAGNOSIS — J849 Interstitial pulmonary disease, unspecified: Secondary | ICD-10-CM

## 2020-01-24 DIAGNOSIS — U071 COVID-19: Secondary | ICD-10-CM

## 2020-01-24 NOTE — Telephone Encounter (Signed)
Called to discuss with patient about COVID-19 symptoms and the use of one of the available treatments for those with mild to moderate Covid symptoms and at a high risk of hospitalization.  Pt appears to qualify for outpatient treatment due to co-morbid conditions and/or a member of an at-risk group in accordance with the FDA Emergency Use Authorization.    Symptom onset: 01/23/20 Vaccinated: Yes June Leap Booster? Yes - not for immunocompromised Immunocompromised? Yes Qualifiers: Common variable immunodeficiency, interstitial pulmonary disease, lymphoid hyperplasia   I spoke with Eric Carson regarding treatments with EUA medication for COVID as he is high risk given his immunocompromised status. He has been having new onset fever and tested positive for Covid through a home test this morning. It was initially recommended by his Immunologist at [facility] that he get oral medication, however he is not able to take oral medication without crushing it and it is not recommended to crush Paxlovid. He is not considered fully vaccinated as he has only received 3 doses per current criteria. He spoke with his Immunologist who agreed with Sotrovimab if available. We discussed the risks, benefits and potential costs of treatment with Sotrovimab and he wishes to continue with treatment.   Eric Piedra, NP 01/24/2020 1:38 PM

## 2020-01-24 NOTE — Progress Notes (Signed)
I connected by phone with Eric Carson on 01/24/2020 at 1:42 PM to discuss the potential use of a new treatment for mild to moderate COVID-19 viral infection in non-hospitalized patients.  This patient is a 50 y.o. male that meets the FDA criteria for Emergency Use Authorization of COVID monoclonal antibody sotrovimab.  Has a (+) direct SARS-CoV-2 viral test result  Has mild or moderate COVID-19   Is NOT hospitalized due to COVID-19  Is within 10 days of symptom onset  Has at least one of the high risk factor(s) for progression to severe COVID-19 and/or hospitalization as defined in EUA.  Specific high risk criteria : Immunosuppressive Disease or Treatment   I have spoken and communicated the following to the patient or parent/caregiver regarding COVID monoclonal antibody treatment:  1. FDA has authorized the emergency use for the treatment of mild to moderate COVID-19 in adults and pediatric patients with positive results of direct SARS-CoV-2 viral testing who are 64 years of age and older weighing at least 40 kg, and who are at high risk for progressing to severe COVID-19 and/or hospitalization.  2. The significant known and potential risks and benefits of COVID monoclonal antibody, and the extent to which such potential risks and benefits are unknown.  3. Information on available alternative treatments and the risks and benefits of those alternatives, including clinical trials.  4. Patients treated with COVID monoclonal antibody should continue to self-isolate and use infection control measures (e.g., wear mask, isolate, social distance, avoid sharing personal items, clean and disinfect "high touch" surfaces, and frequent handwashing) according to CDC guidelines.   5. The patient or parent/caregiver has the option to accept or refuse COVID monoclonal antibody treatment.  After reviewing this information with the patient, the patient has agreed to receive one of the available covid  19 monoclonal antibodies and will be provided an appropriate fact sheet prior to infusion.   Eric Po, FNP 01/24/2020 1:42 PM

## 2020-01-25 ENCOUNTER — Ambulatory Visit (HOSPITAL_COMMUNITY)
Admission: RE | Admit: 2020-01-25 | Discharge: 2020-01-25 | Disposition: A | Discharge status: Home / Self Care | Payer: BC Managed Care – PPO | Source: Ambulatory Visit | Attending: Pulmonary Disease | Admitting: Pulmonary Disease

## 2020-01-25 DIAGNOSIS — U071 COVID-19: Secondary | ICD-10-CM | POA: Insufficient documentation

## 2020-01-25 DIAGNOSIS — R599 Enlarged lymph nodes, unspecified: Secondary | ICD-10-CM | POA: Diagnosis not present

## 2020-01-25 DIAGNOSIS — J849 Interstitial pulmonary disease, unspecified: Secondary | ICD-10-CM

## 2020-01-25 MED ORDER — EPINEPHRINE 0.3 MG/0.3ML IJ SOAJ: 0.3000 mg | Freq: Once | INTRAMUSCULAR | Status: DC | PRN

## 2020-01-25 MED ORDER — ALBUTEROL SULFATE HFA 108 (90 BASE) MCG/ACT IN AERS: 2.0000 | INHALATION_SPRAY | Freq: Once | RESPIRATORY_TRACT | Status: DC | PRN

## 2020-01-25 MED ORDER — METHYLPREDNISOLONE SODIUM SUCC 125 MG IJ SOLR: 125.0000 mg | Freq: Once | INTRAMUSCULAR | Status: DC | PRN

## 2020-01-25 MED ORDER — SOTROVIMAB 500 MG/8ML IV SOLN
500.0000 mg | Freq: Once | INTRAVENOUS | Status: AC
  Administered 2020-01-25: 500 mg via INTRAVENOUS

## 2020-01-25 MED ORDER — SODIUM CHLORIDE 0.9 % IV SOLN: INTRAVENOUS | Status: DC | PRN

## 2020-01-25 MED ORDER — DIPHENHYDRAMINE HCL 50 MG/ML IJ SOLN: 50.0000 mg | Freq: Once | INTRAMUSCULAR | Status: DC | PRN

## 2020-01-25 MED ORDER — FAMOTIDINE IN NACL 20-0.9 MG/50ML-% IV SOLN: 20.0000 mg | Freq: Once | INTRAVENOUS | Status: DC | PRN

## 2020-01-25 NOTE — Discharge Instructions (Signed)

## 2020-01-25 NOTE — Progress Notes (Signed)
Diagnosis: COVID-19  Physician: Dr. Patrick Wright  Procedure: [facility]fusion [facility] Med: Sotrovimab infusion - Provided patient with sotrovimab fact sheet for patients, parents, and caregivers prior to infusion.   Complications: No immediate complications noted  Discharge: Discharged home    

## 2020-01-25 NOTE — Progress Notes (Signed)
Patient reviewed Fact Sheet for Patients, Parents, and Caregivers for Emergency Use Authorization (EUA) of sotrovimab for the Treatment of Coronavirus. Patient also reviewed and is agreeable to the estimated cost of treatment. Patient is agreeable to proceed.   

## 2020-02-14 DIAGNOSIS — R748 Abnormal levels of other serum enzymes: Secondary | ICD-10-CM | POA: Diagnosis not present

## 2020-05-05 DIAGNOSIS — K5100 Ulcerative (chronic) pancolitis without complications: Secondary | ICD-10-CM | POA: Diagnosis not present

## 2020-06-10 DIAGNOSIS — D485 Neoplasm of uncertain behavior of skin: Secondary | ICD-10-CM | POA: Diagnosis not present

## 2020-06-10 DIAGNOSIS — D1801 Hemangioma of skin and subcutaneous tissue: Secondary | ICD-10-CM | POA: Diagnosis not present

## 2020-06-17 ENCOUNTER — Telehealth: Payer: Self-pay | Admitting: Adult Health

## 2020-06-17 NOTE — Telephone Encounter (Signed)
I called patient to discuss Evusheld, a long acting monoclonal antibody injection administered to patients with decreased immune systems or intolerance/allergy to the COVID 19 vaccine as COVID19 prevention.    Unable to reach patient.  LMOM to return my call.  Tanee Henery, NP  

## 2020-06-18 ENCOUNTER — Telehealth: Payer: Self-pay | Admitting: Adult Health

## 2020-06-18 NOTE — Telephone Encounter (Signed)
I called patient to discuss Evusheld, a long acting monoclonal antibody injection administered to patients with decreased immune systems or intolerance/allergy to the COVID 19 vaccine as COVID19 prevention.    Unable to reach patient.  LMOM to return my call.  Scotty Weigelt, NP  

## 2020-06-19 ENCOUNTER — Other Ambulatory Visit: Payer: Self-pay | Admitting: Adult Health

## 2020-06-19 DIAGNOSIS — D839 Common variable immunodeficiency, unspecified: Secondary | ICD-10-CM

## 2020-06-19 NOTE — Progress Notes (Signed)
I connected by phone with Eric Carson on 06/19/2020, 12:05 PM to discuss the potential use of a new treatment, tixagevimab/cilgavimab, for pre-exposure prophylaxis for prevention of coronavirus disease 2019 (COVID-19) caused by the SARS-CoV-2 virus.  This patient is a 50 y.o. male that meets the FDA criteria for Emergency Use Authorization of tixagevimab/cilgavimab for pre-exposure prophylaxis of COVID-19 disease. Pt meets following criteria: Age >12 yr and weight > 40kg Not currently infected with SARS-CoV-2 and has no known recent exposure to an individual infected with SARS-CoV-2 AND Who has moderate to severe immune compromise due to a medical condition or receipt of immunosuppressive medications or treatments and may not mount an adequate immune response to COVID-19 vaccination or  Vaccination with any available COVID-19 vaccine, according to the approved or authorized schedule, is not recommended due to a history of severe adverse reaction (e.g., severe allergic reaction) to a COVID-19 vaccine(s) and/or COVID-19 vaccine component(s).  Patient meets the following definition of mod-severe immune compromised status: 8. Other groups not previously mentioned: 3. CVID  I have spoken and communicated the following to the patient or parent/caregiver regarding COVID monoclonal antibody treatment:  FDA has authorized the emergency use of tixagevimab/cilgavimab for the pre-exposure prophylaxis of COVID-19 in patients with moderate-severe immunocompromised status, who meet above EUA criteria.  The significant known and potential risks and benefits of COVID monoclonal antibody, and the extent to which such potential risks and benefits are unknown.  Information on available alternative treatments and the risks and benefits of those alternatives, including clinical trials.  The patient or parent/caregiver has the option to accept or refuse COVID monoclonal antibody treatment.  After reviewing this  information with the patient, agree to receive tixagevimab/cilgavimab.  Schedule message sent.    Scot Dock, NP, 06/19/2020, 12:05 PM.

## 2020-06-25 ENCOUNTER — Telehealth: Payer: Self-pay

## 2020-06-25 ENCOUNTER — Telehealth: Payer: Self-pay | Admitting: Adult Health

## 2020-06-25 NOTE — Telephone Encounter (Signed)
Attempted to contact patient to schedule Evusheld injection. No answer, left voicemail to call back

## 2020-06-25 NOTE — Telephone Encounter (Signed)
Received VM from patient about getting Evusheld scheduled.  I reached out to our scheduler Joahaura and Jena Gauss RN manager and patient will be contacted today regarding an appointment time to receive the injection.    I called and LMOM for him letting him know to expect a call.  Wilber Bihari, NP

## 2020-06-30 ENCOUNTER — Ambulatory Visit (INDEPENDENT_AMBULATORY_CARE_PROVIDER_SITE_OTHER): Payer: BC Managed Care – PPO

## 2020-06-30 ENCOUNTER — Other Ambulatory Visit: Payer: Self-pay

## 2020-06-30 DIAGNOSIS — Z298 Encounter for other specified prophylactic measures: Secondary | ICD-10-CM | POA: Diagnosis not present

## 2020-06-30 DIAGNOSIS — D839 Common variable immunodeficiency, unspecified: Secondary | ICD-10-CM

## 2020-06-30 MED ORDER — METHYLPREDNISOLONE SODIUM SUCC 125 MG IJ SOLR
125.0000 mg | Freq: Once | INTRAMUSCULAR | Status: AC | PRN
Start: 2020-06-30 — End: ?

## 2020-06-30 MED ORDER — DIPHENHYDRAMINE HCL 50 MG/ML IJ SOLN: 50.0000 mg | Freq: Once | INTRAMUSCULAR | Status: AC | PRN

## 2020-06-30 MED ORDER — TIXAGEVIMAB (PART OF EVUSHELD) INJECTION
300.0000 mg | Freq: Once | INTRAMUSCULAR | Status: AC
  Administered 2020-06-30: 300 mg via INTRAMUSCULAR

## 2020-06-30 MED ORDER — EPINEPHRINE 0.3 MG/0.3ML IJ SOAJ: 0.3000 mg | Freq: Once | INTRAMUSCULAR | Status: AC | PRN

## 2020-06-30 MED ORDER — ALBUTEROL SULFATE HFA 108 (90 BASE) MCG/ACT IN AERS: 2.0000 | INHALATION_SPRAY | Freq: Once | RESPIRATORY_TRACT | Status: AC | PRN

## 2020-06-30 MED ORDER — CILGAVIMAB (PART OF EVUSHELD) INJECTION
300.0000 mg | Freq: Once | INTRAMUSCULAR | Status: AC
  Administered 2020-06-30: 300 mg via INTRAMUSCULAR

## 2020-06-30 NOTE — Progress Notes (Signed)
Diagnosis: Immunocompromised  Provider:  Marshell Garfinkel, MD  Procedure: Injection  Evusheld, Dose: 300 mg, Site: intramuscular  Discharge: Condition: Good, Destination: Home . AVS provided to patient.   Performed by:  Koren Shiver, RN

## 2020-08-20 DIAGNOSIS — R748 Abnormal levels of other serum enzymes: Secondary | ICD-10-CM | POA: Diagnosis not present

## 2020-08-20 DIAGNOSIS — R7989 Other specified abnormal findings of blood chemistry: Secondary | ICD-10-CM | POA: Diagnosis not present

## 2020-08-20 DIAGNOSIS — K760 Fatty (change of) liver, not elsewhere classified: Secondary | ICD-10-CM | POA: Diagnosis not present

## 2020-08-20 DIAGNOSIS — K7400 Hepatic fibrosis, unspecified: Secondary | ICD-10-CM | POA: Diagnosis not present

## 2020-08-26 DIAGNOSIS — E785 Hyperlipidemia, unspecified: Secondary | ICD-10-CM | POA: Diagnosis not present

## 2020-08-26 DIAGNOSIS — Z125 Encounter for screening for malignant neoplasm of prostate: Secondary | ICD-10-CM | POA: Diagnosis not present

## 2020-08-27 DIAGNOSIS — R82998 Other abnormal findings in urine: Secondary | ICD-10-CM | POA: Diagnosis not present

## 2020-08-27 DIAGNOSIS — Z0000 Encounter for general adult medical examination without abnormal findings: Secondary | ICD-10-CM | POA: Diagnosis not present

## 2020-08-27 DIAGNOSIS — Z1331 Encounter for screening for depression: Secondary | ICD-10-CM | POA: Diagnosis not present

## 2020-08-27 DIAGNOSIS — I10 Essential (primary) hypertension: Secondary | ICD-10-CM | POA: Diagnosis not present

## 2020-08-27 DIAGNOSIS — R1319 Other dysphagia: Secondary | ICD-10-CM | POA: Diagnosis not present

## 2020-08-27 DIAGNOSIS — Z1389 Encounter for screening for other disorder: Secondary | ICD-10-CM | POA: Diagnosis not present

## 2020-11-28 DIAGNOSIS — K759 Inflammatory liver disease, unspecified: Secondary | ICD-10-CM | POA: Diagnosis not present

## 2020-11-28 DIAGNOSIS — Z01812 Encounter for preprocedural laboratory examination: Secondary | ICD-10-CM | POA: Diagnosis not present

## 2020-11-28 DIAGNOSIS — S3613XA Injury of bile duct, initial encounter: Secondary | ICD-10-CM | POA: Diagnosis not present

## 2020-11-28 DIAGNOSIS — R748 Abnormal levels of other serum enzymes: Secondary | ICD-10-CM | POA: Diagnosis not present

## 2021-01-11 HISTORY — PX: TOTAL COLECTOMY: SHX852

## 2021-02-03 DIAGNOSIS — C884 Extranodal marginal zone B-cell lymphoma of mucosa-associated lymphoid tissue [MALT-lymphoma]: Secondary | ICD-10-CM | POA: Diagnosis not present

## 2021-02-03 DIAGNOSIS — D839 Common variable immunodeficiency, unspecified: Secondary | ICD-10-CM | POA: Diagnosis not present

## 2021-02-26 DIAGNOSIS — R748 Abnormal levels of other serum enzymes: Secondary | ICD-10-CM | POA: Diagnosis not present

## 2021-03-27 ENCOUNTER — Other Ambulatory Visit: Payer: Self-pay

## 2021-03-27 ENCOUNTER — Telehealth: Payer: Self-pay

## 2021-03-27 DIAGNOSIS — K222 Esophageal obstruction: Secondary | ICD-10-CM

## 2021-03-27 DIAGNOSIS — K51919 Ulcerative colitis, unspecified with unspecified complications: Secondary | ICD-10-CM

## 2021-03-27 MED ORDER — PEG 3350-KCL-NA BICARB-NACL 420 G PO SOLR: 4000.0000 mL | Freq: Once | ORAL | 0 refills | Status: AC

## 2021-03-27 NOTE — Telephone Encounter (Signed)
Office visit scheduled for 03/31/21 at 350 pm.  Message left on pt voice mail with appt information.  He also has Endo colon scheduled for 04/02/21 at 730 am at [facility] with DJ.  The prep was sent, amb referral has been entered and instructions entered.   ? ?

## 2021-03-27 NOTE — Telephone Encounter (Signed)
-----   Message from [name], MD sent at 03/27/2021 12:54 PM EDT ----- ?Eric Carson is a friend of mine and wants me to be his local gastroenterologist.  Gets most of his care through the [facility] system.  He is expecting your call to arrange some appointments.  Can you squeeze him into my office schedule next Tuesday afternoon (21st), double book if needed. ? ?Also put him in for colonoscopy and EGD at [facility] on Thursday the 23rd next week.  Looks like my 730 must have fallen off and so if you could put him in for 730 that morning.  Indications are esophageal stricture and longstanding ulcerative colitis. ? ?His cell is [phone] ? ?Thanks ? ? ? ? ?

## 2021-03-31 ENCOUNTER — Encounter: Payer: Self-pay | Admitting: Gastroenterology

## 2021-03-31 ENCOUNTER — Ambulatory Visit: Payer: BC Managed Care – PPO | Admitting: Gastroenterology

## 2021-03-31 ENCOUNTER — Ambulatory Visit (INDEPENDENT_AMBULATORY_CARE_PROVIDER_SITE_OTHER): Payer: BC Managed Care – PPO | Admitting: Gastroenterology

## 2021-03-31 VITALS — BP 124/80 | HR 81 | Ht 69.0 in | Wt 159.1 lb

## 2021-03-31 DIAGNOSIS — K51919 Ulcerative colitis, unspecified with unspecified complications: Secondary | ICD-10-CM

## 2021-03-31 DIAGNOSIS — K222 Esophageal obstruction: Secondary | ICD-10-CM | POA: Diagnosis not present

## 2021-03-31 NOTE — H&P (View-Only) (Signed)
? ?HPI: ?This is a very pleasant 51 year old man wh who is a friend of mine and who was self-referred for help with colonoscopy and EGD. ? 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  Since then he has been getting IVIG every 4 weeks he was diagnosed with ulcerative colitis around age 67.  He was originally cared for with mesalamine type products but then he started having joint complaints inflammation and instead he was put on Humira and methotrexate.  His care is generally managed by [facility] rheumatologist Dr. Manuella Ghazi.  Currently he is on methotrexate 2.5 mg once daily.  He has 1-2 semiformed stools daily, sometimes he will miss a day.  He never has bleeding.  He never has significant terrible diarrhea.  He has no abdominal pains. ? ?He has struggled with esophageal stricturing for many many years.  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  His last esophageal stricture dilation was at least 5 years ago or so.  In the past several months even a year or 2 he has been having worsening dysphagia.  Liquids go down with some effort.  He has to be very careful about when he chews or otherwise he will have obvious dysphagia.  He only rarely has pyrosis. ? ? ?Old Data Reviewed: ?The following is cut and pasted from [place] office note April 2022: ?Mr. 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 He has had chronic arthritis managed with Humira and methotrexate initially and most recently over the last 4 years with methotrexate alone. He has been doing very well most recently on methotrexate alone. He has been able to taper his methotrexate down. He has not had recent endoscopic evaluation of his esophagus or colon. He obviously is much overdue especially for  colon monitoring. His last colonoscopy was 9 years ago. He does have pancolitis though it was mild on the last evaluation.Has chronic colitis as of over 20 years duration. He is therefore at high risk for neoplasia. However he has been under good control with medications. ? ?Most recent Colonoscopy ([facility] 2012): ?Granularity in the sigmoid colon, in the descending colon, in the transverse colon, at the hepatic ?flexure and in the ascending colon. This was biopsied. ?- One 6 mm polyp in the transverse colon. Resected and retrieved. ?- Stricture in the transverse colon. ?- Biopsies were taken from the right colon, left colon, transverse colon and rectosigmoid colon. ?- Extensive pancolonic ulcerative colitis. This was biopsied. ?Pathology: ?A. RIGHT COLON, ENDOSCOPIC BIOPSY:  ?COLONIC MUCOSA WITH PATCHY SUPERFICIAL ACUTE INFLAMMATION, REACTIVE  ?EPITHELIAL CHANGE AND MINIMAL ARCHITECTURAL DISTORTION.  ?NO GRANULOMA FORMATION IS IDENTIFIED.  ?NEGATIVE FOR DYSPLASIA.  ? ?B. TRANSVERSE COLON, ENDOSCOPIC BIOPSY:  ?FOCAL, CHRONIC ACTIVE COLITIS.  ?NO GRANULOMA FORMATION IS IDENTIFIED.D  ?NEGATIVE FOR DYSPLASIA.  ? ?C. LEFT COLON, ENDOSCOPIC BIOPSY:  ?COLONIC MUCOSA WITH PANETH CELL METAPLASIA AND MILD ARCHITECTURAL  ?DISTORTION.  ?NO ACTIVE INFLAMMATION OR GRANULOMA FORMATION IS IDENTIFIED.  ?NEGATIVE FOR DYSPLASIA.  ? ?D. RECTOSIGMOID COLON, ENDOSCOPIC BIOPSY:  ?COLORECTAL MUCOSA WITH MILD ARCHITECTURAL DISTORTION.  ?NO ACTIVE INFLAMMATION OR GRANULOMA FORMATION IS IDENTIFIED.  ?NEGATIVE FOR DYSPLASIA.   ? ?The following is cut, pasted from 02/2021 [facility] hepatology visit with Dr. Damita Lack: ?We went back over the previous evaluation. We had initially  thought that the diagnosis of primary sclerosing cholangitis was most likely given his elevated alkaline phosphatase and his history of inflammatory bowel disease. MRCP had been normal. The biopsy was to look for small duct PSC as well as AMA negative primary biliary cholangitis. The  findings are nonspecific. I discussed that the trial of ursodiol would have been focused on primary biliary cholangitis and possibly as a diagnostic test. If he had improvement with treatment, that might lend more support to the diagnosis. However, his alkaline phosphatase is improved without any ursodiol. This is the best value for him and his [facility] electronic health record. I therefore suggested we continue monitoring this for now. I gave him external lab orders. He would like to see if he can get labs in his primary care doctor's office. He will reach out to Dr. Brigitte Pulse. We also discussed that he could get labs at [place] if needed. I suggested labs every 3 months. We will track this together. ? ? ? ? ?Review of systems: ?Pertinent positive and negative review of systems were noted in the above HPI section. All other review negative. ? ? ?Past Medical History:  ?Diagnosis Date  ? CVID (common variable immunodeficiency) ([place]) age 51  ? tx at 39- IVIG since age 37  ? Inflammatory arthritis 2007  ? Sinusitis   ? recurrent  ? Ulcerative colitis ([place]) 1994  ? ? ?Past Surgical History:  ?Procedure Laterality Date  ? LYMPH NODE DISSECTION  1983  ? [place] EXTRACTION  1989  ? ? ?Current Outpatient Medications  ?Medication Instructions  ? doxycycline (MONODOX) 50 mg, Oral, Daily PRN  ? methotrexate (RHEUMATREX) 2.5 mg, Oral, Weekly, weekly  ? mometasone (NASONEX) 50 MCG/ACT nasal spray 2 sprays, Nasal, Daily  ? Multiple Vitamins tablet 1 tablet, Oral, 2 times weekly  ? nystatin (MYCOSTATIN) 100000 UNIT/ML suspension 5 mLs, Mouth/Throat, 2 times daily PRN  ? ? ?Allergies as of 03/31/2021 - Review Complete 03/31/2021  ?Allergen Reaction Noted  ? Penicillins Hives 01/22/2015  ? ? ?Family History  ?Problem Relation Age of Onset  ? Lymphoma Mother   ? Arthritis Mother   ? Cancer - Colon Maternal Grandmother   ? Alzheimer's disease Paternal Grandfather   ? Lupus Paternal Aunt   ? Esophageal cancer Neg Hx   ? Stomach cancer  Neg Hx   ? ? ?Social History  ? ?Socioeconomic History  ? Marital status: Married  ?  Spouse name: Sharyn Lull  ? Number of children: 2  ? Years of education: 40  ? Highest education level: Not on file  ?Occupational History  ?  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  ?Tobacco Use  ? Smoking status: Never  ? Smokeless tobacco: Never  ?Vaping Use  ? Vaping Use: Never used  ?Substance and Sexual Activity  ? Alcohol use: Yes  ?  Alcohol/week: 0.0 standard drinks  ?  Comment: social  < 1 day  ? Drug use: No  ? Sexual activity: Not on file  ?Other Topics Concern  ? Not on file  ?Social History Narrative  ? Lives at home with wife, children  ? Caffeine use- soda 5 weekly  ? ?Social Determinants of Health  ? ?Financial Resource Strain: Not on file  ?Food Insecurity: Not on file  ?Transportation Needs: Not on file  ?Physical Activity: Not on file  ?Stress: Not on file  ?Social Connections: Not on file  ?Intimate Partner Violence: Not on file  ? ? ? ?Physical Exam: ?Ht 5' 9"  (1.753 m)  Wt 159 lb 2 oz (72.2 kg)   BMI 23.50 kg/m?  ?Constitutional: generally well-appearing ?Psychiatric: alert and oriented x3 ?Eyes: extraocular movements intact ?Mouth: oral pharynx moist, no lesions ?Neck: supple no lymphadenopathy ?Cardiovascular: heart regular rate and rhythm ?Lungs: clear to auscultation bilaterally ?Abdomen: soft, nontender, nondistended, no obvious ascites, no peritoneal signs, normal bowel sounds ?Extremities: no lower extremity edema bilaterally ?Skin: no lesions on visible extremities ? ? ?Assessment and plan: ?51 y.o. male with longstanding chronic extensive ulcerative colitis, history of esophageal stricturing, CVID;  ? ?He needs reevaluation of his colitis, high risk screening given the fact that he has had extensive ulcerative colitis for at least 25 years or so.  Admittedly his symptoms have been under very very good control for the last 8 or 10 years and he is only on very low-dose methotrexate for this.  I am planning  colonoscopy for him with evaluation of his terminal ileum and extensive biopsies throughout the colon.  I will forward all of these results to his rheumatologist at [facility] who is in charge of his ulcerative coli

## 2021-03-31 NOTE — Patient Instructions (Signed)
If you are age 50 or younger, your body mass index should be between 19-25. Your Body mass index is 23.5 kg/m?Marland Kitchen If this is out of the aformentioned range listed, please consider follow up with your Primary Care Provider.  ?________________________________________________________ ? ?The [place] [facility] providers would like to encourage you to use [employer] to communicate with providers for non-urgent requests or questions.  Due to long hold times on the telephone, sending your provider a message by [facility] may be a faster and more efficient way to get a response.  Please allow 48 business hours for a response.  Please remember that this is for non-urgent requests.  ?_______________________________________________________ ? ?You have been scheduled for an endoscopy and colonoscopy. Please follow the written instructions given to you at your visit today. ?Please pick up your prep supplies at the pharmacy within the next 1-3 days. ?If you use inhalers (even only as needed), please bring them with you on the day of your procedure. ? ?Due to recent changes in healthcare laws, you may see the results of your imaging and laboratory studies on MyChart before your provider has had a chance to review them.  We understand that in some cases there may be results that are confusing or concerning to you. Not all laboratory results come back in the same time frame and the provider may be waiting for multiple results in order to interpret others.  Please give [place] 48 hours in order for your provider to thoroughly review all the results before contacting the office for clarification of your results.  ? ?Thank you for entrusting me with your care and choosing [facility]. ? ?Dr Ardis Hughs ? ?

## 2021-03-31 NOTE — H&P (View-Only) (Signed)
? ?HPI: ?This is a very pleasant 51 year old man wh who is a friend of mine and who was self-referred for help with colonoscopy and EGD. ? 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  Since then he has been getting IVIG every 4 weeks he was diagnosed with ulcerative colitis around age 27.  He was originally cared for with mesalamine type products but then he started having joint complaints inflammation and instead he was put on Humira and methotrexate.  His care is generally managed by [facility] rheumatologist Dr. Manuella Ghazi.  Currently he is on methotrexate 2.5 mg once daily.  He has 1-2 semiformed stools daily, sometimes he will miss a day.  He never has bleeding.  He never has significant terrible diarrhea.  He has no abdominal pains. ? ?He has struggled with esophageal stricturing for many many years.  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  His last esophageal stricture dilation was at least 5 years ago or so.  In the past several months even a year or 2 he has been having worsening dysphagia.  Liquids go down with some effort.  He has to be very careful about when he chews or otherwise he will have obvious dysphagia.  He only rarely has pyrosis. ? ? ?Old Data Reviewed: ?The following is cut and pasted from [place] office note April 2022: ?Mr. 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 He has had chronic arthritis managed with Humira and methotrexate initially and most recently over the last 4 years with methotrexate alone. He has been doing very well most recently on methotrexate alone. He has been able to taper his methotrexate down. He has not had recent endoscopic evaluation of his esophagus or colon. He obviously is much overdue especially for  colon monitoring. His last colonoscopy was 9 years ago. He does have pancolitis though it was mild on the last evaluation.Has chronic colitis as of over 20 years duration. He is therefore at high risk for neoplasia. However he has been under good control with medications. ? ?Most recent Colonoscopy ([facility] 2012): ?Granularity in the sigmoid colon, in the descending colon, in the transverse colon, at the hepatic ?flexure and in the ascending colon. This was biopsied. ?- One 6 mm polyp in the transverse colon. Resected and retrieved. ?- Stricture in the transverse colon. ?- Biopsies were taken from the right colon, left colon, transverse colon and rectosigmoid colon. ?- Extensive pancolonic ulcerative colitis. This was biopsied. ?Pathology: ?A. RIGHT COLON, ENDOSCOPIC BIOPSY:  ?COLONIC MUCOSA WITH PATCHY SUPERFICIAL ACUTE INFLAMMATION, REACTIVE  ?EPITHELIAL CHANGE AND MINIMAL ARCHITECTURAL DISTORTION.  ?NO GRANULOMA FORMATION IS IDENTIFIED.  ?NEGATIVE FOR DYSPLASIA.  ? ?B. TRANSVERSE COLON, ENDOSCOPIC BIOPSY:  ?FOCAL, CHRONIC ACTIVE COLITIS.  ?NO GRANULOMA FORMATION IS IDENTIFIED.D  ?NEGATIVE FOR DYSPLASIA.  ? ?C. LEFT COLON, ENDOSCOPIC BIOPSY:  ?COLONIC MUCOSA WITH PANETH CELL METAPLASIA AND MILD ARCHITECTURAL  ?DISTORTION.  ?NO ACTIVE INFLAMMATION OR GRANULOMA FORMATION IS IDENTIFIED.  ?NEGATIVE FOR DYSPLASIA.  ? ?D. RECTOSIGMOID COLON, ENDOSCOPIC BIOPSY:  ?COLORECTAL MUCOSA WITH MILD ARCHITECTURAL DISTORTION.  ?NO ACTIVE INFLAMMATION OR GRANULOMA FORMATION IS IDENTIFIED.  ?NEGATIVE FOR DYSPLASIA.   ? ?The following is cut, pasted from 02/2021 [facility] hepatology visit with Dr. Damita Lack: ?We went back over the previous evaluation. We had initially  thought that the diagnosis of primary sclerosing cholangitis was most likely given his elevated alkaline phosphatase and his history of inflammatory bowel disease. MRCP had been normal. The biopsy was to look for small duct PSC as well as AMA negative primary biliary cholangitis. The  findings are nonspecific. I discussed that the trial of ursodiol would have been focused on primary biliary cholangitis and possibly as a diagnostic test. If he had improvement with treatment, that might lend more support to the diagnosis. However, his alkaline phosphatase is improved without any ursodiol. This is the best value for him and his [facility] electronic health record. I therefore suggested we continue monitoring this for now. I gave him external lab orders. He would like to see if he can get labs in his primary care doctor's office. He will reach out to Dr. Brigitte Pulse. We also discussed that he could get labs at [place] if needed. I suggested labs every 3 months. We will track this together. ? ? ? ? ?Review of systems: ?Pertinent positive and negative review of systems were noted in the above HPI section. All other review negative. ? ? ?Past Medical History:  ?Diagnosis Date  ? CVID (common variable immunodeficiency) ([place]) age 43  ? tx at 72- IVIG since age 54  ? Inflammatory arthritis 2007  ? Sinusitis   ? recurrent  ? Ulcerative colitis ([place]) 1994  ? ? ?Past Surgical History:  ?Procedure Laterality Date  ? LYMPH NODE DISSECTION  1983  ? [place] EXTRACTION  1989  ? ? ?Current Outpatient Medications  ?Medication Instructions  ? doxycycline (MONODOX) 50 mg, Oral, Daily PRN  ? methotrexate (RHEUMATREX) 2.5 mg, Oral, Weekly, weekly  ? mometasone (NASONEX) 50 MCG/ACT nasal spray 2 sprays, Nasal, Daily  ? Multiple Vitamins tablet 1 tablet, Oral, 2 times weekly  ? nystatin (MYCOSTATIN) 100000 UNIT/ML suspension 5 mLs, Mouth/Throat, 2 times daily PRN  ? ? ?Allergies as of 03/31/2021 - Review Complete 03/31/2021  ?Allergen Reaction Noted  ? Penicillins Hives 01/22/2015  ? ? ?Family History  ?Problem Relation Age of Onset  ? Lymphoma Mother   ? Arthritis Mother   ? Cancer - Colon Maternal Grandmother   ? Alzheimer's disease Paternal Grandfather   ? Lupus Paternal Aunt   ? Esophageal cancer Neg Hx   ? Stomach cancer  Neg Hx   ? ? ?Social History  ? ?Socioeconomic History  ? Marital status: Married  ?  Spouse name: Sharyn Lull  ? Number of children: 2  ? Years of education: 47  ? Highest education level: Not on file  ?Occupational History  ?  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  ?Tobacco Use  ? Smoking status: Never  ? Smokeless tobacco: Never  ?Vaping Use  ? Vaping Use: Never used  ?Substance and Sexual Activity  ? Alcohol use: Yes  ?  Alcohol/week: 0.0 standard drinks  ?  Comment: social  < 1 day  ? Drug use: No  ? Sexual activity: Not on file  ?Other Topics Concern  ? Not on file  ?Social History Narrative  ? Lives at home with wife, children  ? Caffeine use- soda 5 weekly  ? ?Social Determinants of Health  ? ?Financial Resource Strain: Not on file  ?Food Insecurity: Not on file  ?Transportation Needs: Not on file  ?Physical Activity: Not on file  ?Stress: Not on file  ?Social Connections: Not on file  ?Intimate Partner Violence: Not on file  ? ? ? ?Physical Exam: ?Ht 5' 9"  (1.753 m)  Wt 159 lb 2 oz (72.2 kg)   BMI 23.50 kg/m?  ?Constitutional: generally well-appearing ?Psychiatric: alert and oriented x3 ?Eyes: extraocular movements intact ?Mouth: oral pharynx moist, no lesions ?Neck: supple no lymphadenopathy ?Cardiovascular: heart regular rate and rhythm ?Lungs: clear to auscultation bilaterally ?Abdomen: soft, nontender, nondistended, no obvious ascites, no peritoneal signs, normal bowel sounds ?Extremities: no lower extremity edema bilaterally ?Skin: no lesions on visible extremities ? ? ?Assessment and plan: ?51 y.o. male with longstanding chronic extensive ulcerative colitis, history of esophageal stricturing, CVID;  ? ?He needs reevaluation of his colitis, high risk screening given the fact that he has had extensive ulcerative colitis for at least 25 years or so.  Admittedly his symptoms have been under very very good control for the last 8 or 10 years and he is only on very low-dose methotrexate for this.  I am planning  colonoscopy for him with evaluation of his terminal ileum and extensive biopsies throughout the colon.  I will forward all of these results to his rheumatologist at [facility] who is in charge of his ulcerative coli

## 2021-03-31 NOTE — Progress Notes (Signed)
? ?HPI: ?This is a very pleasant 51 year old man wh who is a friend of mine and who was self-referred for help with colonoscopy and EGD. ? 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  Since then he has been getting IVIG every 4 weeks he was diagnosed with ulcerative colitis around age 54.  He was originally cared for with mesalamine type products but then he started having joint complaints inflammation and instead he was put on Humira and methotrexate.  His care is generally managed by [facility] rheumatologist Dr. Manuella Ghazi.  Currently he is on methotrexate 2.5 mg once daily.  He has 1-2 semiformed stools daily, sometimes he will miss a day.  He never has bleeding.  He never has significant terrible diarrhea.  He has no abdominal pains. ? ?He has struggled with esophageal stricturing for many many years.  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  His last esophageal stricture dilation was at least 5 years ago or so.  In the past several months even a year or 2 he has been having worsening dysphagia.  Liquids go down with some effort.  He has to be very careful about when he chews or otherwise he will have obvious dysphagia.  He only rarely has pyrosis. ? ? ?Old Data Reviewed: ?The following is cut and pasted from [facility] office note April 2022: ?Mr. 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 He has had chronic arthritis managed with Humira and methotrexate initially and most recently over the last 4 years with methotrexate alone. He has been doing very well most recently on methotrexate alone. He has been able to taper his methotrexate down. He has not had recent endoscopic evaluation of his esophagus or colon. He obviously is much overdue especially for  colon monitoring. His last colonoscopy was 9 years ago. He does have pancolitis though it was mild on the last evaluation.Has chronic colitis as of over 20 years duration. He is therefore at high risk for neoplasia. However he has been under good control with medications. ? ?Most recent Colonoscopy ([facility] 2012): ?Granularity in the sigmoid colon, in the descending colon, in the transverse colon, at the hepatic ?flexure and in the ascending colon. This was biopsied. ?- One 6 mm polyp in the transverse colon. Resected and retrieved. ?- Stricture in the transverse colon. ?- Biopsies were taken from the right colon, left colon, transverse colon and rectosigmoid colon. ?- Extensive pancolonic ulcerative colitis. This was biopsied. ?Pathology: ?A. RIGHT COLON, ENDOSCOPIC BIOPSY:  ?COLONIC MUCOSA WITH PATCHY SUPERFICIAL ACUTE INFLAMMATION, REACTIVE  ?EPITHELIAL CHANGE AND MINIMAL ARCHITECTURAL DISTORTION.  ?NO GRANULOMA FORMATION IS IDENTIFIED.  ?NEGATIVE FOR DYSPLASIA.  ? ?B. TRANSVERSE COLON, ENDOSCOPIC BIOPSY:  ?FOCAL, CHRONIC ACTIVE COLITIS.  ?NO GRANULOMA FORMATION IS IDENTIFIED.D  ?NEGATIVE FOR DYSPLASIA.  ? ?C. LEFT COLON, ENDOSCOPIC BIOPSY:  ?COLONIC MUCOSA WITH PANETH CELL METAPLASIA AND MILD ARCHITECTURAL  ?DISTORTION.  ?NO ACTIVE INFLAMMATION OR GRANULOMA FORMATION IS IDENTIFIED.  ?NEGATIVE FOR DYSPLASIA.  ? ?D. RECTOSIGMOID COLON, ENDOSCOPIC BIOPSY:  ?COLORECTAL MUCOSA WITH MILD ARCHITECTURAL DISTORTION.  ?NO ACTIVE INFLAMMATION OR GRANULOMA FORMATION IS IDENTIFIED.  ?NEGATIVE FOR DYSPLASIA.   ? ?The following is cut, pasted from 02/2021 [facility] hepatology visit with Dr. Damita Lack: ?We went back over the previous evaluation. We had initially  thought that the diagnosis of primary sclerosing cholangitis was most likely given his elevated alkaline phosphatase and his history of inflammatory bowel disease. MRCP had been normal. The biopsy was to look for small duct PSC as well as AMA negative primary biliary cholangitis. The  findings are nonspecific. I discussed that the trial of ursodiol would have been focused on primary biliary cholangitis and possibly as a diagnostic test. If he had improvement with treatment, that might lend more support to the diagnosis. However, his alkaline phosphatase is improved without any ursodiol. This is the best value for him and his [facility] electronic health record. I therefore suggested we continue monitoring this for now. I gave him external lab orders. He would like to see if he can get labs in his primary care doctor's office. He will reach out to Dr. Brigitte Pulse. We also discussed that he could get labs at [place] if needed. I suggested labs every 3 months. We will track this together. ? ? ? ? ?Review of systems: ?Pertinent positive and negative review of systems were noted in the above HPI section. All other review negative. ? ? ?Past Medical History:  ?Diagnosis Date  ? CVID (common variable immunodeficiency) ([place]) age 9  ? tx at 55- IVIG since age 36  ? Inflammatory arthritis 2007  ? Sinusitis   ? recurrent  ? Ulcerative colitis ([place]) 1994  ? ? ?Past Surgical History:  ?Procedure Laterality Date  ? LYMPH NODE DISSECTION  1983  ? [place] EXTRACTION  1989  ? ? ?Current Outpatient Medications  ?Medication Instructions  ? doxycycline (MONODOX) 50 mg, Oral, Daily PRN  ? methotrexate (RHEUMATREX) 2.5 mg, Oral, Weekly, weekly  ? mometasone (NASONEX) 50 MCG/ACT nasal spray 2 sprays, Nasal, Daily  ? Multiple Vitamins tablet 1 tablet, Oral, 2 times weekly  ? nystatin (MYCOSTATIN) 100000 UNIT/ML suspension 5 mLs, Mouth/Throat, 2 times daily PRN  ? ? ?Allergies as of 03/31/2021 - Review Complete 03/31/2021  ?Allergen Reaction Noted  ? Penicillins Hives 01/22/2015  ? ? ?Family History  ?Problem Relation Age of Onset  ? Lymphoma Mother   ? Arthritis Mother   ? Cancer - Colon Maternal Grandmother   ? Alzheimer's disease Paternal Grandfather   ? Lupus Paternal Aunt   ? Esophageal cancer Neg Hx   ? Stomach cancer  Neg Hx   ? ? ?Social History  ? ?Socioeconomic History  ? Marital status: Married  ?  Spouse name: Sharyn Lull  ? Number of children: 2  ? Years of education: 42  ? Highest education level: Not on file  ?Occupational History  ?  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  ?Tobacco Use  ? Smoking status: Never  ? Smokeless tobacco: Never  ?Vaping Use  ? Vaping Use: Never used  ?Substance and Sexual Activity  ? Alcohol use: Yes  ?  Alcohol/week: 0.0 standard drinks  ?  Comment: social  < 1 day  ? Drug use: No  ? Sexual activity: Not on file  ?Other Topics Concern  ? Not on file  ?Social History Narrative  ? Lives at home with wife, children  ? Caffeine use- soda 5 weekly  ? ?Social Determinants of Health  ? ?Financial Resource Strain: Not on file  ?Food Insecurity: Not on file  ?Transportation Needs: Not on file  ?Physical Activity: Not on file  ?Stress: Not on file  ?Social Connections: Not on file  ?Intimate Partner Violence: Not on file  ? ? ? ?Physical Exam: ?Ht 5' 9"  (1.753 m)  Wt 159 lb 2 oz (72.2 kg)   BMI 23.50 kg/m?  ?Constitutional: generally well-appearing ?Psychiatric: alert and oriented x3 ?Eyes: extraocular movements intact ?Mouth: oral pharynx moist, no lesions ?Neck: supple no lymphadenopathy ?Cardiovascular: heart regular rate and rhythm ?Lungs: clear to auscultation bilaterally ?Abdomen: soft, nontender, nondistended, no obvious ascites, no peritoneal signs, normal bowel sounds ?Extremities: no lower extremity edema bilaterally ?Skin: no lesions on visible extremities ? ? ?Assessment and plan: ?51 y.o. male with longstanding chronic extensive ulcerative colitis, history of esophageal stricturing, CVID;  ? ?He needs reevaluation of his colitis, high risk screening given the fact that he has had extensive ulcerative colitis for at least 25 years or so.  Admittedly his symptoms have been under very very good control for the last 8 or 10 years and he is only on very low-dose methotrexate for this.  I am planning  colonoscopy for him with evaluation of his terminal ileum and extensive biopsies throughout the colon.  I will forward all of these results to his rheumatologist at [facility] who is in charge of his ulcerative coli

## 2021-04-02 ENCOUNTER — Other Ambulatory Visit: Payer: Self-pay

## 2021-04-02 ENCOUNTER — Encounter (HOSPITAL_COMMUNITY)
Admission: RE | Disposition: A | Discharge status: Home / Self Care | Payer: Self-pay | Source: Home / Self Care | Attending: Gastroenterology

## 2021-04-02 ENCOUNTER — Ambulatory Visit (HOSPITAL_COMMUNITY): Payer: BC Managed Care – PPO | Admitting: Anesthesiology

## 2021-04-02 ENCOUNTER — Encounter (HOSPITAL_COMMUNITY): Payer: Self-pay | Admitting: Gastroenterology

## 2021-04-02 ENCOUNTER — Ambulatory Visit (HOSPITAL_COMMUNITY)
Admission: RE | Admit: 2021-04-02 | Discharge: 2021-04-02 | Disposition: A | Discharge status: Home / Self Care | Payer: BC Managed Care – PPO | Attending: Gastroenterology | Admitting: Gastroenterology

## 2021-04-02 ENCOUNTER — Telehealth: Payer: Self-pay

## 2021-04-02 DIAGNOSIS — Z8711 Personal history of peptic ulcer disease: Secondary | ICD-10-CM | POA: Insufficient documentation

## 2021-04-02 DIAGNOSIS — K5100 Ulcerative (chronic) pancolitis without complications: Secondary | ICD-10-CM | POA: Insufficient documentation

## 2021-04-02 DIAGNOSIS — Z8261 Family history of arthritis: Secondary | ICD-10-CM | POA: Diagnosis not present

## 2021-04-02 DIAGNOSIS — K222 Esophageal obstruction: Secondary | ICD-10-CM

## 2021-04-02 DIAGNOSIS — R1310 Dysphagia, unspecified: Secondary | ICD-10-CM | POA: Diagnosis not present

## 2021-04-02 DIAGNOSIS — Z79631 Long term (current) use of antimetabolite agent: Secondary | ICD-10-CM | POA: Insufficient documentation

## 2021-04-02 DIAGNOSIS — D127 Benign neoplasm of rectosigmoid junction: Secondary | ICD-10-CM | POA: Diagnosis not present

## 2021-04-02 DIAGNOSIS — B3781 Candidal esophagitis: Secondary | ICD-10-CM | POA: Diagnosis not present

## 2021-04-02 DIAGNOSIS — K6289 Other specified diseases of anus and rectum: Secondary | ICD-10-CM | POA: Insufficient documentation

## 2021-04-02 DIAGNOSIS — D125 Benign neoplasm of sigmoid colon: Secondary | ICD-10-CM | POA: Diagnosis not present

## 2021-04-02 DIAGNOSIS — Z800 Family history of malignant neoplasm of digestive organs: Secondary | ICD-10-CM | POA: Insufficient documentation

## 2021-04-02 DIAGNOSIS — M1990 Unspecified osteoarthritis, unspecified site: Secondary | ICD-10-CM | POA: Insufficient documentation

## 2021-04-02 DIAGNOSIS — D126 Benign neoplasm of colon, unspecified: Secondary | ICD-10-CM | POA: Diagnosis not present

## 2021-04-02 DIAGNOSIS — K6389 Other specified diseases of intestine: Secondary | ICD-10-CM | POA: Diagnosis not present

## 2021-04-02 DIAGNOSIS — K5190 Ulcerative colitis, unspecified, without complications: Secondary | ICD-10-CM | POA: Diagnosis not present

## 2021-04-02 DIAGNOSIS — B370 Candidal stomatitis: Secondary | ICD-10-CM | POA: Insufficient documentation

## 2021-04-02 DIAGNOSIS — K529 Noninfective gastroenteritis and colitis, unspecified: Secondary | ICD-10-CM | POA: Diagnosis not present

## 2021-04-02 DIAGNOSIS — K51919 Ulcerative colitis, unspecified with unspecified complications: Secondary | ICD-10-CM

## 2021-04-02 HISTORY — PX: COLONOSCOPY WITH PROPOFOL: SHX5780

## 2021-04-02 HISTORY — PX: BIOPSY: SHX5522

## 2021-04-02 HISTORY — PX: ESOPHAGOGASTRODUODENOSCOPY (EGD) WITH PROPOFOL: SHX5813

## 2021-04-02 HISTORY — PX: SUBMUCOSAL TATTOO INJECTION: SHX6856

## 2021-04-02 SURGERY — COLONOSCOPY WITH PROPOFOL
Anesthesia: Monitor Anesthesia Care

## 2021-04-02 MED ORDER — FLUCONAZOLE 10 MG/ML PO SUSR: 100.0000 mg | Freq: Every day | ORAL | 0 refills | Status: AC

## 2021-04-02 MED ORDER — SPOT INK MARKER SYRINGE KIT
PACK | SUBMUCOSAL | Status: AC
  Filled 2021-04-02: qty 5

## 2021-04-02 MED ORDER — PROPOFOL 500 MG/50ML IV EMUL
INTRAVENOUS | Status: DC | PRN
  Administered 2021-04-02: 350 ug/kg/min via INTRAVENOUS

## 2021-04-02 MED ORDER — PROPOFOL 1000 MG/100ML IV EMUL
INTRAVENOUS | Status: AC
  Filled 2021-04-02: qty 200

## 2021-04-02 MED ORDER — EPHEDRINE SULFATE (PRESSORS) 50 MG/ML IJ SOLN
INTRAMUSCULAR | Status: DC | PRN
  Administered 2021-04-02: 10 mg via INTRAVENOUS

## 2021-04-02 MED ORDER — LACTATED RINGERS IV SOLN: INTRAVENOUS | Status: DC

## 2021-04-02 MED ORDER — PHENYLEPHRINE HCL (PRESSORS) 10 MG/ML IV SOLN
INTRAVENOUS | Status: DC | PRN
  Administered 2021-04-02 (×2): 80 ug via INTRAVENOUS

## 2021-04-02 MED ORDER — AMISULPRIDE (ANTIEMETIC) 5 MG/2ML IV SOLN
10.0000 mg | Freq: Once | INTRAVENOUS | Status: DC | PRN
  Filled 2021-04-02: qty 4

## 2021-04-02 MED ORDER — SODIUM CHLORIDE 0.9 % IV SOLN: INTRAVENOUS | Status: DC

## 2021-04-02 MED ORDER — SPOT INK MARKER SYRINGE KIT
PACK | SUBMUCOSAL | Status: DC | PRN
  Administered 2021-04-02: 3.5 mL via SUBMUCOSAL
  Administered 2021-04-02: 2.5 mL via SUBMUCOSAL

## 2021-04-02 MED ORDER — LIDOCAINE HCL (CARDIAC) PF 100 MG/5ML IV SOSY
PREFILLED_SYRINGE | INTRAVENOUS | Status: DC | PRN
Start: 2021-04-02 — End: 2021-04-02
  Administered 2021-04-02: 25 mg via INTRAVENOUS
  Administered 2021-04-02: 75 mg via INTRAVENOUS

## 2021-04-02 MED ORDER — ONDANSETRON HCL 4 MG/2ML IJ SOLN: 4.0000 mg | Freq: Once | INTRAMUSCULAR | Status: DC | PRN

## 2021-04-02 SURGICAL SUPPLY — 25 items

## 2021-04-02 NOTE — Op Note (Signed)
[facility] ?Patient Name: Eric Carson ?Procedure Date: 04/02/2021 ?MRN: [id number] ?Attending MD: [name] , MD ?Date of Birth: [birth year] ?CSN: [id number] ?Age: 51 ?Admit Type: Outpatient ?Procedure:                Upper GI endoscopy ?Indications:              Dysphagia; known chronic stricturing in proximal  ?                          esophagus for years, Last EGD several years ago at  ?                          [facility] ?Providers:                [name], MD, Carmie End, RN,  ?                          William Dalton, Technician ?Referring MD:              ?Medicines:                Monitored Anesthesia Care ?Complications:            No immediate complications. Estimated blood loss:  ?                          None. ?Estimated Blood Loss:     Estimated blood loss: none. ?Procedure:                Pre-Anesthesia Assessment: ?                          - Prior to the procedure, a History and Physical  ?                          was performed, and patient medications and  ?                          allergies were reviewed. The patient's tolerance of  ?                          previous anesthesia was also reviewed. The risks  ?                          and benefits of the procedure and the sedation  ?                          options and risks were discussed with the patient.  ?                          All questions were answered, and informed consent  ?                          was obtained. Prior Anticoagulants: The patient has  ?                          taken no previous  anticoagulant or antiplatelet  ?                          agents. ASA Grade Assessment: II - A patient with  ?                          mild systemic disease. After reviewing the risks  ?                          and benefits, the patient was deemed in  ?                          satisfactory condition to undergo the procedure. ?                          After obtaining informed consent, the endoscope was  ?                           passed under direct vision. Throughout the  ?                          procedure, the patient's blood pressure, pulse, and  ?                          oxygen saturations were monitored continuously. The  ?                          [id number]-[id number] ([id number]) Olympus endoscope was introduced  ?                          through the mouth, and advanced to the second part  ?                          of duodenum. The upper GI endoscopy was  ?                          accomplished without difficulty. The patient  ?                          tolerated the procedure well. ?Scope In: ?Scope Out: ?Findings: ?     Thrush noted in oropharynx ?     In the very proximal esophagus there was a tight, nearly pinhole  ?     stricture. This appears benign. I used a 6-8-47m TTS balloon over a wire  ?     to gently dilate the stricture and then exchanged the adult scope for a  ?     pediatric 5.484mdiameter scope. I was able to advance the pediatric  ?     scope through the site and it was clearly a fairly focal stricture.  ?     There was an appropriate superfical mucosal disruption and very minor  ?     bleeding. The rest of the esophagus was somewhat narrowed thorughout  ?     it's course and there was minimal white exudates distally consistent  ?     with candida. ?  UGI tract to the second duodenum was otherwise normal ?Impression:               - Tight, nearly pinhole, benign appearing focal  ?                          stricture in the very proximal esophugs. Dilated to  ?                          46m. ?                          - Oral thrush. ?                          - Mild distal candida esophagitis. ?Moderate Sedation: ?     Not Applicable - Patient had care per Anesthesia. ?Recommendation:           - Patient has a contact number available for  ?                          emergencies. The signs and symptoms of potential  ?                          delayed complications were discussed with the  ?                           patient. Return to normal activities tomorrow.  ?                          Written discharge instructions were provided to the  ?                          patient. ?                          - Liquid diet today, then soft diet tomorrow, then  ?                          back to normal diet. ?                          - Continue present medications. Diflucan  ?                          prescription, take once daily for 10 days. ?                          - Dr. JArdis Hughs office will arrange repeat dilation  ?                          in 2-3 weeeks. ?Procedure Code(s):        --- Professional --- ?                          [phone] Esophagogastroduodenoscopy, flexible,  ?  transoral; with transendoscopic balloon dilation of  ?                          esophagus (less than 30 mm diameter) ?Diagnosis Code(s):        --- Professional --- ?                          K22.2, Esophageal obstruction ?                          R13.10, Dysphagia, unspecified ?CPT copyright 2019 American Medical Association. All rights reserved. ?The codes documented in this report are preliminary and upon coder review may  ?be revised to meet current compliance requirements. ?[name], MD ?04/02/2021 9:03:48 AM ?This report has been signed electronically. ?Number of Addenda: 0 ?

## 2021-04-02 NOTE — Transfer of Care (Signed)
Immediate Anesthesia Transfer of Care Note ? ?Patient: Eric Carson ? ?Procedure(s) Performed: COLONOSCOPY WITH PROPOFOL ?ESOPHAGOGASTRODUODENOSCOPY (EGD) WITH PROPOFOL ?BIOPSY ?SUBMUCOSAL TATTOO INJECTION ?Balloon dilation wire-guided ? ?Patient Location: PACU ? ?Anesthesia Type:MAC ? ?Level of Consciousness: sedated and patient cooperative ? ?Airway & Oxygen Therapy: Patient Spontanous Breathing and Patient connected to face mask oxygen ? ?Post-op Assessment: Report given to RN, Post -op Vital signs reviewed and stable and Patient moving all extremities X 4 ? ?Post vital signs: stable ? ?Last Vitals:  ?Vitals Value Taken Time  ?BP 93/27 04/02/21 0852  ?Temp 36.9 ?C 04/02/21 0852  ?Pulse 97 04/02/21 0852  ?Resp 13 04/02/21 0852  ?SpO2 100 % 04/02/21 0852  ? ? ?Last Pain:  ?Vitals:  ? 04/02/21 0852  ?TempSrc: Tympanic  ?PainSc: Asleep  ?   ? ?  ? ?Complications: No notable events documented. ?

## 2021-04-02 NOTE — Anesthesia Procedure Notes (Signed)
Procedure Name: [place] ?Date/Time: 04/02/2021 7:30 AM ?Performed by: Lissa Morales, CRNA ?Pre-anesthesia Checklist: Patient identified, Emergency Drugs available, Suction available and Patient being monitored ?Patient Re-evaluated:Patient Re-evaluated prior to induction ?Oxygen Delivery Method: Simple face mask ?Preoxygenation: Pre-oxygenation with 100% oxygen ?Placement Confirmation: positive ETCO2 ? ? ? ? ?

## 2021-04-02 NOTE — Telephone Encounter (Signed)
-----   Message from [name], MD sent at 04/02/2021  9:06 AM EDT ----- ?He needs repeat EGD in 2-3 weeks, add on to my [facility] Thursday.  No further out than 3 weeks, I'll call someone at endo if I need to.  Thanks ? ? ? ?

## 2021-04-02 NOTE — Discharge Instructions (Signed)
YOU HAD AN ENDOSCOPIC PROCEDURE TODAY: Refer to the procedure report and other information in the discharge instructions given to you for any specific questions about what was found during the examination. If this information does not answer your questions, please call [place] office at [phone] to clarify.  ° °YOU SHOULD EXPECT: Some feelings of bloating in the abdomen. Passage of more gas than usual. Walking can help get rid of the air that was put into your GI tract during the procedure and reduce the bloating. If you had a lower endoscopy (such as a colonoscopy or flexible sigmoidoscopy) you may notice spotting of blood in your stool or on the toilet paper. Some abdominal soreness may be present for a day or two, also. ° °DIET: Your first meal following the procedure should be a light meal and then it is ok to progress to your normal diet. A half-sandwich or bowl of soup is an example of a good first meal. Heavy or fried foods are harder to digest and may make you feel nauseous or bloated. Drink plenty of fluids but you should avoid alcoholic beverages for 24 hours. If you had a esophageal dilation, please see attached instructions for diet.   ° °ACTIVITY: Your care partner should take you home directly after the procedure. You should plan to take it easy, moving slowly for the rest of the day. You can resume normal activity the day after the procedure however YOU SHOULD NOT DRIVE, use power tools, machinery or perform tasks that involve climbing or major physical exertion for 24 hours (because of the sedation medicines used during the test).  ° °SYMPTOMS TO REPORT IMMEDIATELY: °A gastroenterologist can be reached at any hour. Please call [phone]  for any of the following symptoms:  °Following lower endoscopy (colonoscopy, flexible sigmoidoscopy) °Excessive amounts of blood in the stool  °Significant tenderness, worsening of abdominal pains  °Swelling of the abdomen that is new, acute  °Fever of 100° or  higher  °Following upper endoscopy (EGD, EUS, ERCP, esophageal dilation) °Vomiting of blood or coffee ground material  °New, significant abdominal pain  °New, significant chest pain or pain under the shoulder blades  °Painful or persistently difficult swallowing  °New shortness of breath  °Black, tarry-looking or red, bloody stools ° °FOLLOW UP:  °If any biopsies were taken you will be contacted by phone or by letter within the next 1-3 weeks. Call [phone]  if you have not heard about the biopsies in 3 weeks.  °Please also call with any specific questions about appointments or follow up tests. ° °

## 2021-04-02 NOTE — Anesthesia Postprocedure Evaluation (Signed)
Anesthesia Post Note ? ?Patient: MACALISTER ARNAUD ? ?Procedure(s) Performed: COLONOSCOPY WITH PROPOFOL ?ESOPHAGOGASTRODUODENOSCOPY (EGD) WITH PROPOFOL ?BIOPSY ?SUBMUCOSAL TATTOO INJECTION ?Balloon dilation wire-guided ? ?  ? ?Patient location during evaluation: PACU ?Anesthesia Type: MAC ?Level of consciousness: awake and alert ?Pain management: pain level controlled ?Vital Signs Assessment: post-procedure vital signs reviewed and stable ?Respiratory status: spontaneous breathing, nonlabored ventilation, respiratory function stable and patient connected to nasal cannula oxygen ?Cardiovascular status: stable and blood pressure returned to baseline ?Postop Assessment: no apparent nausea or vomiting ?Anesthetic complications: no ? ? ?No notable events documented. ? ?Last Vitals:  ?Vitals:  ? 04/02/21 0912 04/02/21 0922  ?BP: 107/71 114/72  ?Pulse: 70 83  ?Resp: 19 17  ?Temp:    ?SpO2: 98% 99%  ?  ?Last Pain:  ?Vitals:  ? 04/02/21 0922  ?TempSrc:   ?PainSc: 0-No pain  ? ? ?  ?  ?  ?  ?  ?  ? ?Effie Berkshire ? ? ? ? ?

## 2021-04-02 NOTE — Op Note (Addendum)
[facility] ?Patient Name: Eric Carson ?Procedure Date: 04/02/2021 ?MRN: [id number] ?Attending MD: [name] , MD ?Date of Birth: [birth year] ?CSN: [id number] ?Age: 51 ?Admit Type: Outpatient ?Procedure:                Colonoscopy ?Indications:              Long standing extensive UC, last colonoscopy about  ?                          10 years ago at [facility] ?Providers:                [name], MD, Carmie End, RN,  ?                          William Dalton, Technician ?Referring MD:              ?Medicines:                 ?Complications:            No immediate complications. Estimated blood loss:  ?                          None. ?Estimated Blood Loss:     Estimated blood loss: none. ?Procedure:                Pre-Anesthesia Assessment: ?                          - Prior to the procedure, a History and Physical  ?                          was performed, and patient medications and  ?                          allergies were reviewed. The patient's tolerance of  ?                          previous anesthesia was also reviewed. The risks  ?                          and benefits of the procedure and the sedation  ?                          options and risks were discussed with the patient.  ?                          All questions were answered, and informed consent  ?                          was obtained. Prior Anticoagulants: The patient has  ?                          taken no previous anticoagulant or antiplatelet  ?                          agents. ASA Grade Assessment: II -  A patient with  ?                          mild systemic disease. After reviewing the risks  ?                          and benefits, the patient was deemed in  ?                          satisfactory condition to undergo the procedure. ?                          After obtaining informed consent, the colonoscope  ?                          was passed under direct vision. Throughout the  ?                           procedure, the patient's blood pressure, pulse, and  ?                          oxygen saturations were monitored continuously. The  ?                          CF-HQ190L ([id number]) Olympus colonoscope was  ?                          introduced through the anus and advanced to the the  ?                          terminal ileum. The colonoscopy was performed  ?                          without difficulty. The patient tolerated the  ?                          procedure well. The quality of the bowel  ?                          preparation was good. The terminal ileum, ileocecal  ?                          valve, appendiceal orifice, and rectum were  ?                          photographed. ?Scope In: 7:38:20 AM ?Scope Out: 8:14:35 AM ?Scope Withdrawal Time: 0 hours 33 minutes 20 seconds  ?Total Procedure Duration: 0 hours 36 minutes 15 seconds  ?Findings: ?     The terminal ileum appeared normal. ?     The colon mucosa was granular throughout, from the anus to the cecum.  ?     Slightly increased vascular pattern as well. At most I would call this  ?     very mild inflammation. I took multiple biopsies from each segment  ?     (ascending, transverse, descending,  sigmoid, rectum) in separate jars. ?     There was a previous blue submucosal tattoo site in the descending  ?     colon, the mucosa around it was normal. ?     There were 2 sites of more abnormal mucosa in the colon: ?     The first area was at 55 cm, see images. This was about 1 cm across and  ?     it clearly caused a bit of puckering of the mucosa. I biopsied this site  ?     extensively and then performed 3 submucosal injections of spot to label  ?     the site. ?     The second area of abnormal mucosa was at 15 cm, this was flat, about 2  ?     cm across, there was a slight border, this might be a standard adenoma.  ?     I biopsied the edges of this site and then labeled it with 2 submucosal  ?     injections of spot. ?Impression:               The  colon mucosa was granular throughout, from the  ?                          anus to the cecum. Slightly increased vascular  ?                          pattern as well. At most I would call this very  ?                          mild inflammation. I took multiple biopsies from  ?                          each segment (ascending, transverse, descending,  ?                          sigmoid, rectum) in separate jars. ?                          There was a previous blue submucosal tattoo site in  ?                          the descending colon, the mucosa around it was  ?                          normal. ?                          There were 2 sites of more abnormal mucosa in the  ?                          colon: ?                          The first area of abnormal mucosa was at 55 cm, see  ?  images. This was about 1 cm across and it clearly  ?                          caused a bit of puckering of the mucosa. I biopsied  ?                          this site extensively and then performed 3  ?                          submucosal injections of spot to label the site. ?                          The second area of abnormal mucosa was at 15 cm,  ?                          this was flat, about 2 cm across, there was a  ?                          slight border, this might be a standard adenoma. I  ?                          biopsied the edges of this site and then labeled it  ?                          with 2 submucosal injections of spot. ?                          Normal terminal ileum ?Moderate Sedation: ?     Not Applicable - Patient had care per Anesthesia. ?Recommendation:           - Await pathology results. ?                          - EGD now. ?Procedure Code(s):        --- Professional --- ?                          [phone], Colonoscopy, flexible; diagnostic, including  ?                          collection of specimen(s) by brushing or washing,  ?                          when performed  (separate procedure) ?Diagnosis Code(s):        --- Professional --- ?                          K51.00, Ulcerative (chronic) pancolitis without  ?                          complications ?CPT copyright 2019 American Medical Association. All rights reserved. ?The codes documented in this report are preliminary and upon coder review may  ?be revised to meet current compliance requirements. ?[name], MD ?04/02/2021 8:51:34 AM ?  This report has been signed electronically. ?Number of Addenda: 0 ?

## 2021-04-02 NOTE — Anesthesia Preprocedure Evaluation (Addendum)
Anesthesia Evaluation  ?Patient identified by MRN, date of birth, ID band ?Patient awake ? ? ? ?Reviewed: ?Allergy & Precautions, NPO status , Patient's Chart, lab work & pertinent test results ? ?Airway ?Mallampati: II ? ?TM Distance: >3 FB ?Neck ROM: Full ? ? ? Dental ? ?(+) Teeth Intact, Dental Advisory Given ?  ?Pulmonary ?neg pulmonary ROS,  ?  ?breath sounds clear to auscultation ? ? ? ? ? ? Cardiovascular ?negative cardio ROS ? ? ?Rhythm:Regular Rate:Normal ? ? ?  ?Neuro/Psych ?negative neurological ROS ? negative psych ROS  ? GI/Hepatic ?Neg liver ROS, PUD,   ?Endo/Other  ?negative endocrine ROS ? Renal/GU ?negative Renal ROS  ? ?  ?Musculoskeletal ? ?(+) Arthritis ,  ? Abdominal ?Normal abdominal exam  (+)   ?Peds ? Hematology ?negative hematology ROS ?(+)   ?Anesthesia Other Findings ? ? Reproductive/Obstetrics ? ?  ? ? ? ? ? ? ? ? ? ? ? ? ? ?  ?  ? ? ? ? ? ? ? ?Anesthesia Physical ?Anesthesia Plan ? ?ASA: 2 ? ?Anesthesia Plan: MAC  ? ?Post-op Pain Management:   ? ?Induction: Intravenous ? ?PONV Risk Score and Plan: 0 and Propofol infusion ? ?Airway Management Planned: Natural Airway and Simple Face Mask ? ?Additional Equipment: None ? ?Intra-op Plan:  ? ?Post-operative Plan:  ? ?Informed Consent: I have reviewed the patients History and Physical, chart, labs and discussed the procedure including the risks, benefits and alternatives for the proposed anesthesia with the patient or authorized representative who has indicated his/her understanding and acceptance.  ? ? ? ? ? ?Plan Discussed with: CRNA ? ?Anesthesia Plan Comments:   ? ? ? ? ? ?Anesthesia Quick Evaluation ? ?

## 2021-04-02 NOTE — Telephone Encounter (Signed)
The pt has been scheduled for 04/16/21 at 12:15 pm with Dr Ardis Hughs at [facility]  ?I have sent the appt information to the pt via mail and My Chart ?Left message on machine to call back  ?

## 2021-04-02 NOTE — Interval H&P Note (Signed)
History and Physical Interval Note: ? ?04/02/2021 ?7:19 AM ? ?Eric Carson  has presented today for surgery, with the diagnosis of Indications are esophageal stricture and longstanding ulcerative colitis..  The various methods of treatment have been discussed with the patient and family. After consideration of risks, benefits and other options for treatment, the patient has consented to  Procedure(s): ?COLONOSCOPY WITH PROPOFOL (N/A) ?ESOPHAGOGASTRODUODENOSCOPY (EGD) WITH PROPOFOL (N/A) as a surgical intervention.  The patient's history has been reviewed, patient examined, no change in status, stable for surgery.  I have reviewed the patient's chart and labs.  Questions were answered to the patient's satisfaction.   ? ? ?[name] ? ? ?

## 2021-04-03 ENCOUNTER — Encounter (HOSPITAL_COMMUNITY): Payer: Self-pay | Admitting: Gastroenterology

## 2021-04-03 LAB — SURGICAL PATHOLOGY

## 2021-04-03 NOTE — Telephone Encounter (Signed)
Dr Ardis Hughs the pt called to say that he is unable to keep the EGD appt on 4/6.  He says he has an appt with several MD's that can not be changed.  That is your only date available within the 3 week time frame you specified.  Please advise  ?

## 2021-04-03 NOTE — Telephone Encounter (Signed)
Great!

## 2021-04-06 ENCOUNTER — Telehealth: Payer: Self-pay | Admitting: Gastroenterology

## 2021-04-06 DIAGNOSIS — K5100 Ulcerative (chronic) pancolitis without complications: Secondary | ICD-10-CM | POA: Diagnosis not present

## 2021-04-06 NOTE — Telephone Encounter (Signed)
Eric Carson is aware of the results of these biopsies.  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  He had I spoke about this at length over the weekend. ? ?We discussed total abdominal colectomy.  He also understands that some centers are performing endoscopic mucosal resection of lesions with high-grade dysplasia.  I am not sure if he would be a candidate for from the looks of these, especially the lesion at 55 cm that has some puckering associated with it. ? ? ? ? ? ? ?Eric Carson, ?Can you please reach out to [facility] this morning.  I need to speak with his IBD doctor there, Eric Mutton, MD  If she cannot make the call this morning then either a covering MD or a fellow that she is working with hopefully.  Please interrupt me during clinic if needed, this is an important phone call.  Thank you very much ? ? ? ?

## 2021-04-06 NOTE — Telephone Encounter (Signed)
I have sent the request for path specimens to be sent to [facility].  Keisha at path was sent the request.  She will have those specimens sent per her protocol.   ?

## 2021-04-06 NOTE — Telephone Encounter (Signed)
I spoke with the nurse at the office of Evalee Mutton, MD she is sending the message to Dr Redmond Pulling to call your cell this morning.  I also made it clear that this call is important and if Dr Redmond Pulling could not take the call this morning to please send to covering MD or a fellow.  Let me know if you do not get a call in a few hours I will all back.  ?

## 2021-04-08 ENCOUNTER — Encounter (HOSPITAL_COMMUNITY): Payer: Self-pay | Admitting: Gastroenterology

## 2021-04-08 NOTE — Progress Notes (Signed)
Attempted to obtain medical history via telephone, unable to reach at this time. I left a voicemail to return pre surgical testing department's phone call.  

## 2021-04-13 DIAGNOSIS — Z7960 Long term (current) use of unspecified immunomodulators and immunosuppressants: Secondary | ICD-10-CM | POA: Diagnosis not present

## 2021-04-13 DIAGNOSIS — K5100 Ulcerative (chronic) pancolitis without complications: Secondary | ICD-10-CM | POA: Diagnosis not present

## 2021-04-16 ENCOUNTER — Encounter (HOSPITAL_COMMUNITY)
Admission: RE | Disposition: A | Discharge status: Home / Self Care | Payer: Self-pay | Source: Home / Self Care | Attending: Gastroenterology

## 2021-04-16 ENCOUNTER — Other Ambulatory Visit: Payer: Self-pay

## 2021-04-16 ENCOUNTER — Ambulatory Visit (HOSPITAL_COMMUNITY)
Admission: RE | Admit: 2021-04-16 | Discharge: 2021-04-16 | Disposition: A | Discharge status: Home / Self Care | Payer: BC Managed Care – PPO | Attending: Gastroenterology | Admitting: Gastroenterology

## 2021-04-16 ENCOUNTER — Encounter (HOSPITAL_COMMUNITY): Payer: Self-pay | Admitting: Gastroenterology

## 2021-04-16 ENCOUNTER — Ambulatory Visit (HOSPITAL_COMMUNITY): Payer: BC Managed Care – PPO | Admitting: Anesthesiology

## 2021-04-16 ENCOUNTER — Telehealth: Payer: Self-pay

## 2021-04-16 DIAGNOSIS — K222 Esophageal obstruction: Secondary | ICD-10-CM | POA: Diagnosis not present

## 2021-04-16 DIAGNOSIS — R1310 Dysphagia, unspecified: Secondary | ICD-10-CM | POA: Diagnosis not present

## 2021-04-16 DIAGNOSIS — K224 Dyskinesia of esophagus: Secondary | ICD-10-CM | POA: Diagnosis not present

## 2021-04-16 HISTORY — PX: ESOPHAGOGASTRODUODENOSCOPY (EGD) WITH PROPOFOL: SHX5813

## 2021-04-16 HISTORY — PX: BALLOON DILATION: SHX5330

## 2021-04-16 SURGERY — ESOPHAGOGASTRODUODENOSCOPY (EGD) WITH PROPOFOL
Anesthesia: Monitor Anesthesia Care

## 2021-04-16 MED ORDER — PROPOFOL 500 MG/50ML IV EMUL
INTRAVENOUS | Status: DC | PRN
  Administered 2021-04-16: 300 ug/kg/min via INTRAVENOUS

## 2021-04-16 MED ORDER — DEXMEDETOMIDINE (PRECEDEX) IN NS 20 MCG/5ML (4 MCG/ML) IV SYRINGE
PREFILLED_SYRINGE | INTRAVENOUS | Status: DC | PRN
  Administered 2021-04-16 (×2): 10 ug via INTRAVENOUS

## 2021-04-16 MED ORDER — LACTATED RINGERS IV SOLN
INTRAVENOUS | Status: AC | PRN
  Administered 2021-04-16: 10 mL/h via INTRAVENOUS

## 2021-04-16 MED ORDER — PROPOFOL 10 MG/ML IV BOLUS
INTRAVENOUS | Status: AC
  Filled 2021-04-16: qty 20

## 2021-04-16 MED ORDER — SODIUM CHLORIDE 0.9 % IV SOLN: INTRAVENOUS | Status: DC

## 2021-04-16 MED ORDER — PROPOFOL 10 MG/ML IV BOLUS
INTRAVENOUS | Status: DC | PRN
  Administered 2021-04-16: 70 mg via INTRAVENOUS
  Administered 2021-04-16: 20 mg via INTRAVENOUS
  Administered 2021-04-16: 50 mg via INTRAVENOUS
  Administered 2021-04-16: 30 mg via INTRAVENOUS

## 2021-04-16 SURGICAL SUPPLY — 15 items

## 2021-04-16 NOTE — Telephone Encounter (Signed)
EGD on 05/07/21 at 1130 am with DJ at [facility].   ? ?Left message on machine to call back  ?

## 2021-04-16 NOTE — Op Note (Signed)
[facility] ?Patient Name: Eric Carson ?Procedure Date: 04/16/2021 ?MRN: [id number] ?Attending MD: [name] , MD ?Date of Birth: [birth year] ?CSN: [id number] ?Age: 51 ?Admit Type: Outpatient ?Procedure:                Upper GI endoscopy ?Indications:              Chronic esophageal stricture, dilated numerous  ?                          times at [facility], last [facility]GD was around 2015.  ?                          EGD 04/02/21 Dr. Ardis Hughs found focal, severe 53m  ?                          lumen stricture in very proximal esophagus; dilated  ?                          to 847musing CRE balloon. Swallowing much improved. ?Providers:                DaMilus BanisterMD, LiLurline DelRN, JuCharlean Merl?                          LaPurcell NailsTechnician, GrCleda DaubCRNA ?Referring MD:              ?Medicines:                Monitored Anesthesia Care ?Complications:            No immediate complications. Estimated blood loss:  ?                          None. ?Estimated Blood Loss:     Estimated blood loss: none. ?Procedure:                Pre-Anesthesia Assessment: ?                          - Prior to the procedure, a History and Physical  ?                          was performed, and patient medications and  ?                          allergies were reviewed. The patient's tolerance of  ?                          previous anesthesia was also reviewed. The risks  ?                          and benefits of the procedure and the sedation  ?                          options and risks were discussed with the patient.  ?  All questions were answered, and informed consent  ?                          was obtained. Prior Anticoagulants: The patient has  ?                          taken no previous anticoagulant or antiplatelet  ?                          agents. ASA Grade Assessment: II - A patient with  ?                          mild systemic disease. After reviewing the risks  ?                           and benefits, the patient was deemed in  ?                          satisfactory condition to undergo the procedure. ?                          After obtaining informed consent, the endoscope was  ?                          passed under direct vision. Throughout the  ?                          procedure, the patient's blood pressure, pulse, and  ?                          oxygen saturations were monitored continuously. The  ?                          [id number] ([id number]) Olympus endoscope was introduced  ?                          through the mouth, and advanced to the body of the  ?                          stomach. The upper GI endoscopy was accomplished  ?                          without difficulty. The patient tolerated the  ?                          procedure well. ?Scope In: ?Scope Out: ?Findings: ?     The same, very proximal esophagus stricture was again located (18-19cm  ?     from incisors). The lumen at the stricture was 3-9m. I used a CRE TTS  ?     balloon over a carefully placed wire to dilate the stricture up to 139m  ?     This again resulted in a significant but superficial appearing mucosal  ?     wrent with self limited bleeding. I could not  advance the adult  ?     gastroscope through the site even after dilation but a 5.69m pediatric  ?     gastroscope easily traversed into the stomach. There was anotomic  ?     tortuosity of the mid esophagus causing an second area of mild stenosis.  ?     This is of unclear clinical signficance. ?     The exam was otherwise without abnormality. ?Impression:               The same, very proximal esophagus stricture was  ?                          again located (18-19cm from incisors). This lumen  ?                          at the stricture was 3-472m I used a CRE TTS  ?                          balloon over a carefully placed wire to dilate the  ?                          stricture up to 1055mThis again resulted in a  ?                           significant but superficial appearing mucosal wrent  ?                          with self limited bleeding. I could not advance the  ?                          adult gastroscope through the site even after  ?                          dilation but a 5.4mm99mdiatric gastroscope easily  ?                          traversed into the stomach. There was anotomic  ?                          tortuosity of the mid esophagus causing an second  ?                          area of mild stenosis. This is of unclear clinical  ?                          signficance. ?Moderate Sedation: ?     Not Applicable - Patient had care per Anesthesia. ?Recommendation:           - Patient has a contact number available for  ?                          emergencies. The signs and symptoms of potential  ?  delayed complications were discussed with the  ?                          patient. Return to normal activities tomorrow.  ?                          Written discharge instructions were provided to the  ?                          patient. ?                          - Liquid diet today, soft diet tomorrow then  ?                          regular food as tolerated. ?                          - Continue present medications. ?                          - My office will arrange repeat EGD in 2-3 weeks  ?                          for further dilation. ?Procedure Code(s):        --- Professional --- ?                          [phone], 52, Esophagogastroduodenoscopy, flexible,  ?                          transoral; with transendoscopic balloon dilation of  ?                          esophagus (less than 30 mm diameter) ?Diagnosis Code(s):        --- Professional --- ?                          K22.2, Esophageal obstruction ?                          R13.10, Dysphagia, unspecified ?CPT copyright 2019 American Medical Association. All rights reserved. ?The codes documented in this report are preliminary and upon coder review may  ?be  revised to meet current compliance requirements. ?[name], MD ?04/16/2021 12:26:42 PM ?This report has been signed electronically. ?Number of Addenda: 0 ?

## 2021-04-16 NOTE — Anesthesia Preprocedure Evaluation (Signed)
Anesthesia Evaluation  ?Patient identified by MRN, date of birth, ID band ?Patient awake ? ? ? ?Reviewed: ?Allergy & Precautions, NPO status , Patient's Chart, lab work & pertinent test results ? ?Airway ?Mallampati: II ? ?TM Distance: >3 FB ?Neck ROM: Full ? ? ? Dental ?no notable dental hx. ? ?  ?Pulmonary ?neg pulmonary ROS,  ?  ?Pulmonary exam normal ?breath sounds clear to auscultation ? ? ? ? ? ? Cardiovascular ?negative cardio ROS ?Normal cardiovascular exam ?Rhythm:Regular Rate:Normal ? ? ?  ?Neuro/Psych ?negative neurological ROS ? negative psych ROS  ? GI/Hepatic ?Neg liver ROS, UC ?  ?Endo/Other  ?negative endocrine ROS ? Renal/GU ?negative Renal ROS  ?negative genitourinary ?  ?Musculoskeletal ?negative musculoskeletal ROS ?(+)  ? Abdominal ?  ?Peds ?negative pediatric ROS ?(+)  Hematology ?negative hematology ROS ?(+)   ?Anesthesia Other Findings ? ? Reproductive/Obstetrics ?negative OB ROS ? ?  ? ? ? ? ? ? ? ? ? ? ? ? ? ?  ?  ? ? ? ? ? ? ? ? ?Anesthesia Physical ?Anesthesia Plan ? ?ASA: 2 ? ?Anesthesia Plan: MAC  ? ?Post-op Pain Management:   ? ?Induction: Intravenous ? ?PONV Risk Score and Plan: 1 and Treatment may vary due to age or medical condition ? ?Airway Management Planned: Simple Face Mask ? ?Additional Equipment:  ? ?Intra-op Plan:  ? ?Post-operative Plan:  ? ?Informed Consent: I have reviewed the patients History and Physical, chart, labs and discussed the procedure including the risks, benefits and alternatives for the proposed anesthesia with the patient or authorized representative who has indicated his/her understanding and acceptance.  ? ? ? ?Dental advisory given ? ?Plan Discussed with: CRNA and Surgeon ? ?Anesthesia Plan Comments:   ? ? ? ? ? ? ?Anesthesia Quick Evaluation ? ?

## 2021-04-16 NOTE — Anesthesia Postprocedure Evaluation (Signed)
Anesthesia Post Note ? ?Patient: Eric Carson ? ?Procedure(s) Performed: ESOPHAGOGASTRODUODENOSCOPY (EGD) WITH PROPOFOL ?BALLOON DILATION ? ?  ? ?Patient location during evaluation: PACU ?Anesthesia Type: MAC ?Level of consciousness: awake and alert ?Pain management: pain level controlled ?Vital Signs Assessment: post-procedure vital signs reviewed and stable ?Respiratory status: spontaneous breathing, nonlabored ventilation, respiratory function stable and patient connected to nasal cannula oxygen ?Cardiovascular status: stable and blood pressure returned to baseline ?Postop Assessment: no apparent nausea or vomiting ?Anesthetic complications: no ? ? ?No notable events documented. ? ?Last Vitals:  ?Vitals:  ? 04/16/21 1231 04/16/21 1240  ?BP: 109/60 105/64  ?Pulse: 76 69  ?Resp: (!) 23 (!) 27  ?Temp:    ?SpO2: 97% 94%  ?  ?Last Pain:  ?Vitals:  ? 04/16/21 1220  ?TempSrc: Temporal  ?PainSc:   ? ? ?  ?  ?  ?  ?  ?  ? ?Brinae Woods S ? ? ? ? ?

## 2021-04-16 NOTE — Telephone Encounter (Signed)
-----   Message from [name], MD sent at 04/16/2021 12:27 PM EDT ----- ?He needs repeat EGD with dilation at [facility] 2-3 weeks max, add to the end of my Thursday schedule. ? ?Thanks ? ? ? ?

## 2021-04-16 NOTE — Interval H&P Note (Signed)
History and Physical Interval Note: ? ?04/16/2021 ?11:33 AM ? ?Eric Carson  has presented today for surgery, with the diagnosis of esophageal stricture.  The various methods of treatment have been discussed with the patient and family. After consideration of risks, benefits and other options for treatment, the patient has consented to  Procedure(s): ?ESOPHAGOGASTRODUODENOSCOPY (EGD) WITH PROPOFOL (N/A) as a surgical intervention.  The patient's history has been reviewed, patient examined, no change in status, stable for surgery.  I have reviewed the patient's chart and labs.  Questions were answered to the patient's satisfaction.   ? ? ?[name] ? ? ?

## 2021-04-16 NOTE — Discharge Instructions (Signed)
YOU HAD AN ENDOSCOPIC PROCEDURE TODAY: Refer to the procedure report and other information in the discharge instructions given to you for any specific questions about what was found during the examination. If this information does not answer your questions, please call [place] office at [phone] to clarify.  ° °YOU SHOULD EXPECT: Some feelings of bloating in the abdomen. Passage of more gas than usual. Walking can help get rid of the air that was put into your GI tract during the procedure and reduce the bloating. If you had a lower endoscopy (such as a colonoscopy or flexible sigmoidoscopy) you may notice spotting of blood in your stool or on the toilet paper. Some abdominal soreness may be present for a day or two, also. ° °DIET: Your first meal following the procedure should be a light meal and then it is ok to progress to your normal diet. A half-sandwich or bowl of soup is an example of a good first meal. Heavy or fried foods are harder to digest and may make you feel nauseous or bloated. Drink plenty of fluids but you should avoid alcoholic beverages for 24 hours. If you had a esophageal dilation, please see attached instructions for diet.   ° °ACTIVITY: Your care partner should take you home directly after the procedure. You should plan to take it easy, moving slowly for the rest of the day. You can resume normal activity the day after the procedure however YOU SHOULD NOT DRIVE, use power tools, machinery or perform tasks that involve climbing or major physical exertion for 24 hours (because of the sedation medicines used during the test).  ° °SYMPTOMS TO REPORT IMMEDIATELY: °A gastroenterologist can be reached at any hour. Please call [phone]  for any of the following symptoms:  °Following lower endoscopy (colonoscopy, flexible sigmoidoscopy) °Excessive amounts of blood in the stool  °Significant tenderness, worsening of abdominal pains  °Swelling of the abdomen that is new, acute  °Fever of 100° or  higher  °Following upper endoscopy (EGD, EUS, ERCP, esophageal dilation) °Vomiting of blood or coffee ground material  °New, significant abdominal pain  °New, significant chest pain or pain under the shoulder blades  °Painful or persistently difficult swallowing  °New shortness of breath  °Black, tarry-looking or red, bloody stools ° °FOLLOW UP:  °If any biopsies were taken you will be contacted by phone or by letter within the next 1-3 weeks. Call [phone]  if you have not heard about the biopsies in 3 weeks.  °Please also call with any specific questions about appointments or follow up tests. ° °

## 2021-04-16 NOTE — Transfer of Care (Signed)
Immediate Anesthesia Transfer of Care Note ? ?Patient: Eric Carson ? ?Procedure(s) Performed: ESOPHAGOGASTRODUODENOSCOPY (EGD) WITH PROPOFOL ?BALLOON DILATION ? ?Patient Location: PACU ? ?Anesthesia Type:MAC ? ?Level of Consciousness: awake, alert , oriented and patient cooperative ? ?Airway & Oxygen Therapy: Patient Spontanous Breathing and Patient connected to face mask oxygen ? ?Post-op Assessment: Report given to RN and Post -op Vital signs reviewed and stable ? ?Post vital signs: Reviewed and stable ? ?Last Vitals:  ?Vitals Value Taken Time  ?BP 110/72 04/16/21 1222  ?Temp 36.8 ?C 04/16/21 1220  ?Pulse 73 04/16/21 1224  ?Resp 21 04/16/21 1224  ?SpO2 99 % 04/16/21 1224  ?Vitals shown include unvalidated device data. ? ?Last Pain:  ?Vitals:  ? 04/16/21 1220  ?TempSrc: Temporal  ?PainSc:   ?   ? ?  ? ?Complications: No notable events documented. ?

## 2021-04-17 ENCOUNTER — Encounter (HOSPITAL_COMMUNITY): Payer: Self-pay | Admitting: Gastroenterology

## 2021-04-20 NOTE — Telephone Encounter (Signed)
Left message on machine to call back  

## 2021-04-21 NOTE — Telephone Encounter (Signed)
EGD scheduled, pt instructed and medications reviewed.  Patient instructions mailed to home and sent to My Chart .  Patient to call with any questions or concerns. ? ?

## 2021-04-28 DIAGNOSIS — C884 Extranodal marginal zone B-cell lymphoma of mucosa-associated lymphoid tissue [MALT-lymphoma]: Secondary | ICD-10-CM | POA: Diagnosis not present

## 2021-04-28 DIAGNOSIS — D839 Common variable immunodeficiency, unspecified: Secondary | ICD-10-CM | POA: Diagnosis not present

## 2021-04-30 ENCOUNTER — Encounter (HOSPITAL_COMMUNITY): Payer: Self-pay | Admitting: Gastroenterology

## 2021-04-30 NOTE — Progress Notes (Signed)
Attempted to obtain medical history via telephone, unable to reach at this time. I left a voicemail to return pre surgical testing department's phone call.  

## 2021-05-07 ENCOUNTER — Ambulatory Visit (HOSPITAL_COMMUNITY): Payer: BC Managed Care – PPO | Admitting: Anesthesiology

## 2021-05-07 ENCOUNTER — Encounter (HOSPITAL_COMMUNITY)
Admission: RE | Disposition: A | Discharge status: Home / Self Care | Payer: Self-pay | Source: Ambulatory Visit | Attending: Gastroenterology

## 2021-05-07 ENCOUNTER — Encounter (HOSPITAL_COMMUNITY): Payer: Self-pay | Admitting: Gastroenterology

## 2021-05-07 ENCOUNTER — Other Ambulatory Visit: Payer: Self-pay

## 2021-05-07 ENCOUNTER — Ambulatory Visit (HOSPITAL_COMMUNITY)
Admission: RE | Admit: 2021-05-07 | Discharge: 2021-05-07 | Disposition: A | Discharge status: Home / Self Care | Payer: BC Managed Care – PPO | Source: Ambulatory Visit | Attending: Gastroenterology | Admitting: Gastroenterology

## 2021-05-07 DIAGNOSIS — K222 Esophageal obstruction: Secondary | ICD-10-CM | POA: Diagnosis not present

## 2021-05-07 DIAGNOSIS — R1310 Dysphagia, unspecified: Secondary | ICD-10-CM | POA: Diagnosis not present

## 2021-05-07 DIAGNOSIS — R1013 Epigastric pain: Secondary | ICD-10-CM | POA: Diagnosis not present

## 2021-05-07 DIAGNOSIS — M069 Rheumatoid arthritis, unspecified: Secondary | ICD-10-CM | POA: Diagnosis not present

## 2021-05-07 HISTORY — PX: ESOPHAGOGASTRODUODENOSCOPY (EGD) WITH PROPOFOL: SHX5813

## 2021-05-07 HISTORY — PX: BALLOON DILATION: SHX5330

## 2021-05-07 HISTORY — PX: BIOPSY: SHX5522

## 2021-05-07 SURGERY — ESOPHAGOGASTRODUODENOSCOPY (EGD) WITH PROPOFOL
Anesthesia: Monitor Anesthesia Care

## 2021-05-07 MED ORDER — MIDAZOLAM HCL 2 MG/2ML IJ SOLN
INTRAMUSCULAR | Status: AC
  Filled 2021-05-07: qty 2

## 2021-05-07 MED ORDER — PROPOFOL 500 MG/50ML IV EMUL
INTRAVENOUS | Status: DC | PRN
Start: 2021-05-07 — End: 2021-05-07
  Administered 2021-05-07: 150 ug/kg/min via INTRAVENOUS

## 2021-05-07 MED ORDER — PROPOFOL 1000 MG/100ML IV EMUL
INTRAVENOUS | Status: AC
  Filled 2021-05-07: qty 100

## 2021-05-07 MED ORDER — LACTATED RINGERS IV SOLN: INTRAVENOUS | Status: DC | PRN

## 2021-05-07 MED ORDER — PHENYLEPHRINE HCL (PRESSORS) 10 MG/ML IV SOLN
INTRAVENOUS | Status: DC | PRN
  Administered 2021-05-07: 80 ug via INTRAVENOUS

## 2021-05-07 MED ORDER — MIDAZOLAM HCL 5 MG/5ML IJ SOLN
INTRAMUSCULAR | Status: DC | PRN
Start: 2021-05-07 — End: 2021-05-07
  Administered 2021-05-07: 2 mg via INTRAVENOUS

## 2021-05-07 SURGICAL SUPPLY — 15 items

## 2021-05-07 NOTE — Anesthesia Postprocedure Evaluation (Signed)
Anesthesia Post Note ? ?Patient: Eric Carson ? ?Procedure(s) Performed: ESOPHAGOGASTRODUODENOSCOPY (EGD) WITH PROPOFOL ?BALLOON DILATION ?BIOPSY ? ?  ? ?Patient location during evaluation: PACU ?Anesthesia Type: MAC ?Level of consciousness: awake and alert ?Pain management: pain level controlled ?Vital Signs Assessment: post-procedure vital signs reviewed and stable ?Respiratory status: spontaneous breathing, nonlabored ventilation, respiratory function stable and patient connected to nasal cannula oxygen ?Cardiovascular status: stable and blood pressure returned to baseline ?Postop Assessment: no apparent nausea or vomiting ?Anesthetic complications: no ? ? ?No notable events documented. ? ?Last Vitals:  ?Vitals:  ? 05/07/21 1050 05/07/21 1055  ?BP: 118/66 111/72  ?Pulse: 79 70  ?Resp: 18 (!) 23  ?Temp:    ?SpO2: 98% 99%  ?  ?Last Pain:  ?Vitals:  ? 05/07/21 1055  ?TempSrc:   ?PainSc: 0-No pain  ? ? ?  ?  ?  ?  ?  ?  ? ?Cruz Devilla ? ? ? ? ?

## 2021-05-07 NOTE — Anesthesia Preprocedure Evaluation (Addendum)
Anesthesia Evaluation  ?Patient identified by MRN, date of birth, ID band ?Patient awake ? ? ? ?Reviewed: ?Allergy & Precautions, NPO status , Patient's Chart, lab work & pertinent test results ? ?Airway ?Mallampati: II ? ?TM Distance: >3 FB ?Neck ROM: Full ? ? ? Dental ?no notable dental hx. ?(+) Teeth Intact, Dental Advisory Given, Missing,  ?  ?Pulmonary ?neg pulmonary ROS,  ?  ?Pulmonary exam normal ?breath sounds clear to auscultation ? ? ? ? ? ? Cardiovascular ?negative cardio ROS ?Normal cardiovascular exam ?Rhythm:Regular Rate:Normal ? ? ?  ?Neuro/Psych ?negative neurological ROS ? negative psych ROS  ? GI/Hepatic ?Neg liver ROS, UC ?  ?Endo/Other  ?negative endocrine ROS ? Renal/GU ?negative Renal ROS  ?negative genitourinary ?  ?Musculoskeletal ? ?(+) Arthritis , Rheumatoid disorders,   ? Abdominal ?  ?Peds ?negative pediatric ROS ?(+)  Hematology ?negative hematology ROS ?(+)   ?Anesthesia Other Findings ? ? Reproductive/Obstetrics ?negative OB ROS ? ?  ? ? ? ? ? ? ? ? ? ? ? ? ? ?  ?  ? ? ? ? ? ? ? ?Anesthesia Physical ? ?Anesthesia Plan ? ?ASA: 2 ? ?Anesthesia Plan: MAC  ? ?Post-op Pain Management: Minimal or no pain anticipated  ? ?Induction: Intravenous ? ?PONV Risk Score and Plan: 1 and Treatment may vary due to age or medical condition ? ?Airway Management Planned: Simple Face Mask, Nasal Cannula and Natural Airway ? ?Additional Equipment: None ? ?Intra-op Plan:  ? ?Post-operative Plan:  ? ?Informed Consent: I have reviewed the patients History and Physical, chart, labs and discussed the procedure including the risks, benefits and alternatives for the proposed anesthesia with the patient or authorized representative who has indicated his/her understanding and acceptance.  ? ? ? ?Dental advisory given ? ?Plan Discussed with: CRNA, Surgeon and Anesthesiologist ? ?Anesthesia Plan Comments:   ? ? ? ? ? ? ?Anesthesia Quick Evaluation ? ?

## 2021-05-07 NOTE — H&P (Signed)
?Chronic esophageal stricture, dilated numerous times at [facility], last [facility] was around 2015. EGD 04/02/21 Dr. Ardis Hughs found focal, severe (41m lumen) stricture in very proximal esophagus; dilated to 864musing CRE balloon. The hypopharynx/most proximal aspect of the esophagus was quite tortuous and so just getting to the stricture was somewhat challenging. Swallowing much improved.  EGD 04/16/2021 Dr. JaArdis Hughssame stricture noted, dilated to 1043mith CRE balloon. Again, the hypopharynx/most proximal aspect of the esophagus was quite tortuous and so just getting to the stricture was somewhat challenging. ? ? ?HPI: ?This is a man with chronic very proximal esophagus stricture, here for repeat dilation ? ?ROS: complete GI ROS as described in HPI, all other review negative. ? ?Constitutional:  No unintentional weight loss ? ? ?Past Medical History:  ?Diagnosis Date  ? CVID (common variable immunodeficiency) ([place] [id number] since age [age] Inflammatory arthritis 2007  ? Sinusitis   ? recurrent  ? Ulcerative colitis ([place]  ? ? ?Past Surgical History:  ?Procedure Laterality Date  ? BALLOON DILATION N/A 04/16/2021  ? Procedure: BALLOON DILATION;  Surgeon: JacMilus BanisterD;  [facility];  Service: Gastroenterology;  Laterality: N/A;  ? BIOPSY  04/02/2021  ? Procedure: BIOPSY;  Surgeon: JacMilus BanisterD;  [facility];  Service: Gastroenterology;;  ? COLONOSCOPY WITH PROPOFOL N/A 04/02/2021  ? Procedure: COLONOSCOPY WITH PROPOFOL;  Surgeon: JacMilus BanisterD;  [facility];  Service: Gastroenterology;  Laterality: N/A;  ? ESOPHAGOGASTRODUODENOSCOPY (EGD) WITH PROPOFOL N/A 04/02/2021  ? Procedure: ESOPHAGOGASTRODUODENOSCOPY (EGD) WITH PROPOFOL;  Surgeon: JacMilus BanisterD;  [facility];  Service: Gastroenterology;  Laterality: N/A;  ? ESOPHAGOGASTRODUODENOSCOPY (EGD) WITH PROPOFOL N/A 04/16/2021  ? Procedure: ESOPHAGOGASTRODUODENOSCOPY (EGD) WITH PROPOFOL;   Surgeon: JacMilus BanisterD;  Location: [facility];  Service: Gastroenterology;  Laterality: N/A;  ? LYMPH NODE DISSECTION  1983  ? SUBMUCOSAL TATTOO INJECTION  04/02/2021  ? Procedure: SUBMUCOSAL TATTOO INJECTION;  Surgeon: JacMilus BanisterD;  [facility];  Service: Gastroenterology;;  ? [place] ? ?Current Outpatient Medications  ?Medication Instructions  ? ARTIFICIAL TEAR SOLUTION OP 1 drop, Both Eyes, Daily PRN  ? doxycycline (VIBRAMYCIN) 100 mg, Oral, Daily PRN  ? folic acid (FOLVITE) 800962g, Oral, Daily  ? GAMUNEX-C 40 GM/400ML SOLN 1 Dose, Injection, Every 28 days  ? methotrexate (RHEUMATREX) 2.5 mg, Oral, Weekly, Monday  ? mometasone (NASONEX) 50 MCG/ACT nasal spray 2 sprays, Nasal, Daily  ? Multiple Vitamins tablet 1 tablet, Oral, 3 times weekly  ? nystatin (MYCOSTATIN) 100000 UNIT/ML suspension 5 mLs, Mouth/Throat, 2 times daily PRN  ? ? ?Allergies as of 04/16/2021 - Review Complete 04/16/2021  ?Allergen Reaction Noted  ? Amoxicillin Hives 04/14/2021  ? ? ?Family History  ?Problem Relation Age of Onset  ? Lymphoma Mother   ? Arthritis Mother   ? Cancer - Colon Maternal Grandmother   ? Alzheimer's disease Paternal Grandfather   ? Lupus Paternal Aunt   ? Esophageal cancer Neg Hx   ? Stomach cancer Neg Hx   ? ? ?Social History  ? ?Socioeconomic History  ? Marital status: Married  ?  Spouse name: MicSharyn Lull Number of children: 2  ? Years of education: 16 52 Highest education level: Not on file  ?Occupational History  ?  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  ?Tobacco Use  ? Smoking status: Never  ? Smokeless tobacco: Never  ?  Vaping Use  ? Vaping Use: Never used  ?Substance and Sexual Activity  ? Alcohol use: Yes  ?  Alcohol/week: 0.0 standard drinks  ?  Comment: social  < 1 day  ? Drug use: No  ? Sexual activity: Not on file  ?Other Topics Concern  ? Not on file  ?Social History Narrative  ? Lives at home with wife, children  ? Caffeine use- soda 5 weekly  ? ?Social  Determinants of Health  ? ?Financial Resource Strain: Not on file  ?Food Insecurity: Not on file  ?Transportation Needs: Not on file  ?Physical Activity: Not on file  ?Stress: Not on file  ?Social Connections: Not on file  ?Intimate Partner Violence: Not on file  ? ? ? ?Physical Exam: ? ?Constitutional: generally well-appearing ?Psychiatric: alert and oriented x3 ?Abdomen: soft, nontender, nondistended, no obvious ascites, no peritoneal signs, normal bowel sounds ?No peripheral edema noted in lower extremities ? ?Assessment and plan: ?51 y.o. male with very proximal esophageal stricture ? ?Repeat EGD with dilation today ? ?Please see the "Patient Instructions" section for addition details about the plan. ? ?Owens Loffler, MD ?[facility] ?05/07/2021, 9:46 AM ? ? ?

## 2021-05-07 NOTE — Op Note (Signed)
[facility] ?Patient Name: Eric Carson ?Procedure Date: 05/07/2021 ?MRN: [id number] ?Attending MD: [name] , MD ?Date of Birth: December 25, 1970 ?CSN: [id number] ?Age: 51 ?Admit Type: Outpatient ?Procedure:                Upper GI endoscopy ?Indications:              Dysphagia; Chronic esophageal stricture, dilated  ?                          numerous times at [facility], last [facility] was around  ?                          2015. EGD 04/02/21 Dr. Ardis Hughs found focal, severe  ?                          (65m lumen) stricture in very proximal esophagus;  ?                          dilated to 891musing CRE balloon. The  ?                          hypopharynx/most proximal aspect of the esophagus  ?                          was quite tortuous and so just getting to the  ?                          stricture was somewhat challenging. Swallowing much  ?                          improved. EGD 04/16/2021 Dr. JaArdis Hughssame stricture  ?                          noted, dilated to 1082mith CRE balloon. Again, the  ?                          hypopharynx/most proximal aspect of the esophagus  ?                          was quite tortuous and so just getting to the  ?                          stricture was somewhat challenging. ?Providers:                DanMilus BanisterD, EliDoristine JohnsN, Clae  ?                          HouPsychologist, counsellingecMerchant navy officereferring MD:              ?Medicines:                Monitored Anesthesia Care ?Complications:            No immediate complications. Estimated blood loss:  ?  None. ?Estimated Blood Loss:     Estimated blood loss: none. ?Procedure:                Pre-Anesthesia Assessment: ?                          - Prior to the procedure, a History and Physical  ?                          was performed, and patient medications and  ?                          allergies were reviewed. The patient's tolerance of  ?                          previous  anesthesia was also reviewed. The risks  ?                          and benefits of the procedure and the sedation  ?                          options and risks were discussed with the patient.  ?                          All questions were answered, and informed consent  ?                          was obtained. Prior Anticoagulants: The patient has  ?                          taken no previous anticoagulant or antiplatelet  ?                          agents. ASA Grade Assessment: II - A patient with  ?                          mild systemic disease. After reviewing the risks  ?                          and benefits, the patient was deemed in  ?                          satisfactory condition to undergo the procedure. ?                          After obtaining informed consent, the endoscope was  ?                          passed under direct vision. Throughout the  ?                          procedure, the patient's blood pressure, pulse, and  ?  oxygen saturations were monitored continuously. The  ?                          GIF-H190 ([id number]) Olympus endoscope was introduced  ?                          through the mouth, and advanced to the antrum of  ?                          the stomach. The upper GI endoscopy was  ?                          accomplished without difficulty. The patient  ?                          tolerated the procedure well. ?Scope In: ?Scope Out: ?Findings: ?     The hypopharynx and most proximal aspect of the esophagus were again  ?     noted to be very tortuous. It was again challenging to even reach the  ?     proximal aspect of the focal stricture in the very proximal esophagus.  ?     The focal stricture was clearly more open than both previous upper  ?     endoscopies in the past month. I estimate the lumen diameter through the  ?     stricture is 5 or 6 mm. I used a CRE TTS balloon to sequentially dilate  ?     the stricture from 10 mm up to 12 mm. This resulted  in usual superficial  ?     mucosal disruption and self-limited bleeding. ?     Following balloon dilation I was able to advance the adult gastroscope  ?     through the stricture without having to change to the pediatric  ?     gastroscope and so I was able to get a very good evaluation of the rest  ?     of the esophagus. The body of the esophagus is overall narrow, this was  ?     especially notable proximally. ?     There were some muscular appearing incomplete rings throughout the  ?     esophagus as well as a granular appearance to the mucosa. I biopsied the  ?     distal esophagus and proximal esophagus separately and sent to pathology. ?     The exam was otherwise without abnormality. ?Impression:               - Very proximal focal esophageal stricture dilated  ?                          up to 12 mm today with CRE balloon. ?                          - Body of esophagus was overall narrow and had some  ?                          incomplete muscular rings and overall granular  ?  appearance to the mucosa. I biopsied the esophagus  ?                          to check for eosinophilic esophagitis. ?                          - Await final pathology results. I will likely  ?                          recommend repeat dilation in 3 to 4 weeks pending  ?                          pathology results. ?Moderate Sedation: ?     Not Applicable - Patient had care per Anesthesia. ?Recommendation:           - Patient has a contact number available for  ?                          emergencies. The signs and symptoms of potential  ?                          delayed complications were discussed with the  ?                          patient. Return to normal activities tomorrow.  ?                          Written discharge instructions were provided to the  ?                          patient. ?                          - Resume previous diet. ?                          - Continue present medications. ?                           - Await pathology results. ?Procedure Code(s):        --- Professional --- ?                          [phone], 52, Esophagogastroduodenoscopy, flexible,  ?                          transoral; with transendoscopic balloon dilation of  ?                          esophagus (less than 30 mm diameter) ?Diagnosis Code(s):        --- Professional --- ?                          K22.2, Esophageal obstruction ?  R13.10, Dysphagia, unspecified ?CPT copyright 2019 American Medical Association. All rights reserved. ?The codes documented in this report are preliminary and upon coder review may  ?be revised to meet current compliance requirements. ?[name], MD ?05/07/2021 10:48:20 AM ?This report has been signed electronically. ?Number of Addenda: 0 ?

## 2021-05-07 NOTE — Discharge Instructions (Signed)
YOU HAD AN ENDOSCOPIC PROCEDURE TODAY: Refer to the procedure report and other information in the discharge instructions given to you for any specific questions about what was found during the examination. If this information does not answer your questions, please call [place] office at [phone] to clarify.   YOU SHOULD EXPECT: Some feelings of bloating in the abdomen. Passage of more gas than usual. Walking can help get rid of the air that was put into your GI tract during the procedure and reduce the bloating. If you had a lower endoscopy (such as a colonoscopy or flexible sigmoidoscopy) you may notice spotting of blood in your stool or on the toilet paper. Some abdominal soreness may be present for a day or two, also.  DIET: Your first meal following the procedure should be a light meal and then it is ok to progress to your normal diet. A half-sandwich or bowl of soup is an example of a good first meal. Heavy or fried foods are harder to digest and may make you feel nauseous or bloated. Drink plenty of fluids but you should avoid alcoholic beverages for 24 hours. If you had a esophageal dilation, please see attached instructions for diet.    ACTIVITY: Your care partner should take you home directly after the procedure. You should plan to take it easy, moving slowly for the rest of the day. You can resume normal activity the day after the procedure however YOU SHOULD NOT DRIVE, use power tools, machinery or perform tasks that involve climbing or major physical exertion for 24 hours (because of the sedation medicines used during the test).   SYMPTOMS TO REPORT IMMEDIATELY: A gastroenterologist can be reached at any hour. Please call [phone]  for any of the following symptoms:   Following upper endoscopy (EGD, EUS, ERCP, esophageal dilation) Vomiting of blood or coffee ground material  New, significant abdominal pain  New, significant chest pain or pain under the shoulder blades  Painful or  persistently difficult swallowing  New shortness of breath  Black, tarry-looking or red, bloody stools  FOLLOW UP:  If any biopsies were taken you will be contacted by phone or by letter within the next 1-3 weeks. Call [phone]  if you have not heard about the biopsies in 3 weeks.  Please also call with any specific questions about appointments or follow up tests.  

## 2021-05-07 NOTE — Transfer of Care (Signed)
Immediate Anesthesia Transfer of Care Note ? ?Patient: Eric Carson ? ?Procedure(s) Performed: ESOPHAGOGASTRODUODENOSCOPY (EGD) WITH PROPOFOL ?BALLOON DILATION ?BIOPSY ? ?Patient Location: PACU ? ?Anesthesia Type:MAC ? ?Level of Consciousness: awake, alert  and oriented ? ?Airway & Oxygen Therapy: Patient Spontanous Breathing and Patient connected to face mask oxygen ? ?Post-op Assessment: Report given to RN and Post -op Vital signs reviewed and stable ? ?Post vital signs: Reviewed and stable ? ?Last Vitals:  ?Vitals Value Taken Time  ?BP 100/52 05/07/21 1038  ?Temp    ?Pulse 81 05/07/21 1038  ?Resp 25 05/07/21 1038  ?SpO2 100 % 05/07/21 1038  ?Vitals shown include unvalidated device data. ? ?Last Pain:  ?Vitals:  ? 05/07/21 0949  ?TempSrc: Temporal  ?PainSc: 0-No pain  ?   ? ?  ? ?Complications: No notable events documented. ?

## 2021-05-08 ENCOUNTER — Encounter (HOSPITAL_COMMUNITY): Payer: Self-pay | Admitting: Gastroenterology

## 2021-05-08 LAB — SURGICAL PATHOLOGY

## 2021-05-11 HISTORY — PX: ABDOMINOPERINEAL PROCTOCOLECTOMY: SUR8

## 2021-06-04 DIAGNOSIS — K51018 Ulcerative (chronic) pancolitis with other complication: Secondary | ICD-10-CM | POA: Diagnosis not present

## 2021-07-03 DIAGNOSIS — E039 Hypothyroidism, unspecified: Secondary | ICD-10-CM | POA: Diagnosis not present

## 2021-07-03 DIAGNOSIS — C884 Extranodal marginal zone B-cell lymphoma of mucosa-associated lymphoid tissue [MALT-lymphoma]: Secondary | ICD-10-CM | POA: Diagnosis not present

## 2021-07-03 DIAGNOSIS — K51018 Ulcerative (chronic) pancolitis with other complication: Secondary | ICD-10-CM | POA: Diagnosis not present

## 2021-07-03 DIAGNOSIS — M459 Ankylosing spondylitis of unspecified sites in spine: Secondary | ICD-10-CM | POA: Diagnosis not present

## 2021-07-03 DIAGNOSIS — Z01818 Encounter for other preprocedural examination: Secondary | ICD-10-CM | POA: Diagnosis not present

## 2021-07-03 DIAGNOSIS — Z7189 Other specified counseling: Secondary | ICD-10-CM | POA: Diagnosis not present

## 2021-07-03 DIAGNOSIS — D839 Common variable immunodeficiency, unspecified: Secondary | ICD-10-CM | POA: Diagnosis not present

## 2021-07-03 DIAGNOSIS — I10 Essential (primary) hypertension: Secondary | ICD-10-CM | POA: Diagnosis not present

## 2021-07-03 DIAGNOSIS — Z7960 Long term (current) use of unspecified immunomodulators and immunosuppressants: Secondary | ICD-10-CM | POA: Diagnosis not present

## 2021-07-03 DIAGNOSIS — R1319 Other dysphagia: Secondary | ICD-10-CM | POA: Diagnosis not present

## 2021-07-07 DIAGNOSIS — R188 Other ascites: Secondary | ICD-10-CM | POA: Diagnosis not present

## 2021-07-07 DIAGNOSIS — Z4659 Encounter for fitting and adjustment of other gastrointestinal appliance and device: Secondary | ICD-10-CM | POA: Diagnosis not present

## 2021-07-07 DIAGNOSIS — R140 Abdominal distension (gaseous): Secondary | ICD-10-CM | POA: Diagnosis not present

## 2021-07-07 DIAGNOSIS — Z85828 Personal history of other malignant neoplasm of skin: Secondary | ICD-10-CM | POA: Diagnosis not present

## 2021-07-07 DIAGNOSIS — D84821 Immunodeficiency due to drugs: Secondary | ICD-10-CM | POA: Diagnosis not present

## 2021-07-07 DIAGNOSIS — Z8619 Personal history of other infectious and parasitic diseases: Secondary | ICD-10-CM | POA: Diagnosis not present

## 2021-07-07 DIAGNOSIS — K51018 Ulcerative (chronic) pancolitis with other complication: Secondary | ICD-10-CM | POA: Diagnosis not present

## 2021-07-07 DIAGNOSIS — J9811 Atelectasis: Secondary | ICD-10-CM | POA: Diagnosis not present

## 2021-07-07 DIAGNOSIS — K209 Esophagitis, unspecified without bleeding: Secondary | ICD-10-CM | POA: Diagnosis not present

## 2021-07-07 DIAGNOSIS — K51918 Ulcerative colitis, unspecified with other complication: Secondary | ICD-10-CM | POA: Diagnosis not present

## 2021-07-07 DIAGNOSIS — Z880 Allergy status to penicillin: Secondary | ICD-10-CM | POA: Diagnosis not present

## 2021-07-07 DIAGNOSIS — K7400 Hepatic fibrosis, unspecified: Secondary | ICD-10-CM | POA: Diagnosis not present

## 2021-07-07 DIAGNOSIS — R590 Localized enlarged lymph nodes: Secondary | ICD-10-CM | POA: Diagnosis not present

## 2021-07-07 DIAGNOSIS — K222 Esophageal obstruction: Secondary | ICD-10-CM | POA: Diagnosis not present

## 2021-07-07 DIAGNOSIS — K567 Ileus, unspecified: Secondary | ICD-10-CM | POA: Diagnosis not present

## 2021-07-07 DIAGNOSIS — Z79899 Other long term (current) drug therapy: Secondary | ICD-10-CM | POA: Diagnosis not present

## 2021-07-07 DIAGNOSIS — K6389 Other specified diseases of intestine: Secondary | ICD-10-CM | POA: Diagnosis not present

## 2021-07-07 DIAGNOSIS — Z5331 Laparoscopic surgical procedure converted to open procedure: Secondary | ICD-10-CM | POA: Diagnosis not present

## 2021-07-07 DIAGNOSIS — J90 Pleural effusion, not elsewhere classified: Secondary | ICD-10-CM | POA: Diagnosis not present

## 2021-07-07 DIAGNOSIS — G8918 Other acute postprocedural pain: Secondary | ICD-10-CM | POA: Diagnosis not present

## 2021-07-07 DIAGNOSIS — K2289 Other specified disease of esophagus: Secondary | ICD-10-CM | POA: Diagnosis not present

## 2021-07-07 DIAGNOSIS — K9189 Other postprocedural complications and disorders of digestive system: Secondary | ICD-10-CM | POA: Diagnosis not present

## 2021-07-07 DIAGNOSIS — D839 Common variable immunodeficiency, unspecified: Secondary | ICD-10-CM | POA: Diagnosis not present

## 2021-07-07 DIAGNOSIS — Z5309 Procedure and treatment not carried out because of other contraindication: Secondary | ICD-10-CM | POA: Diagnosis not present

## 2021-07-07 DIAGNOSIS — E785 Hyperlipidemia, unspecified: Secondary | ICD-10-CM | POA: Diagnosis not present

## 2021-07-07 DIAGNOSIS — I10 Essential (primary) hypertension: Secondary | ICD-10-CM | POA: Diagnosis not present

## 2021-07-07 DIAGNOSIS — G4733 Obstructive sleep apnea (adult) (pediatric): Secondary | ICD-10-CM | POA: Diagnosis not present

## 2021-07-07 DIAGNOSIS — K5989 Other specified functional intestinal disorders: Secondary | ICD-10-CM | POA: Diagnosis not present

## 2021-07-07 DIAGNOSIS — Z4682 Encounter for fitting and adjustment of non-vascular catheter: Secondary | ICD-10-CM | POA: Diagnosis not present

## 2021-07-07 DIAGNOSIS — K760 Fatty (change of) liver, not elsewhere classified: Secondary | ICD-10-CM | POA: Diagnosis not present

## 2021-07-07 DIAGNOSIS — E039 Hypothyroidism, unspecified: Secondary | ICD-10-CM | POA: Diagnosis not present

## 2021-07-23 DIAGNOSIS — K9413 Enterostomy malfunction: Secondary | ICD-10-CM | POA: Diagnosis not present

## 2021-07-23 DIAGNOSIS — K51018 Ulcerative (chronic) pancolitis with other complication: Secondary | ICD-10-CM | POA: Diagnosis not present

## 2021-07-29 ENCOUNTER — Telehealth: Payer: Self-pay

## 2021-07-29 NOTE — Telephone Encounter (Signed)
-----   Message from [name], MD sent at 07/29/2021  3:53 PM EDT ----- Can you please contact [facility] physician Dr. Sheryn Bison.  We share this patient and she removed his colon about a month ago.  I would like to discuss the final pathology results from his colon.  I am concerned that the dysplastic lesions which I found were not recognized on the colectomy surgery pathology report.  Please give her my direct cell number and let her know she can call anytime.  Thanks

## 2021-07-29 NOTE — Telephone Encounter (Signed)
Dr Ardis Hughs I was able to reach the office of Dr Sheryn Bison.  Her office will contact her and have her call your cell as her soonest convenience.

## 2021-07-29 NOTE — Telephone Encounter (Signed)
Guy Begin, MD   [facility] [place], [place] [address]   [phone] (Work)   [phone] (Fax)

## 2021-07-31 NOTE — Telephone Encounter (Signed)
I spoke with Dr. 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

## 2021-08-06 DIAGNOSIS — E860 Dehydration: Secondary | ICD-10-CM | POA: Diagnosis not present

## 2021-08-06 DIAGNOSIS — K51018 Ulcerative (chronic) pancolitis with other complication: Secondary | ICD-10-CM | POA: Diagnosis not present

## 2021-08-06 DIAGNOSIS — Z436 Encounter for attention to other artificial openings of urinary tract: Secondary | ICD-10-CM | POA: Diagnosis not present

## 2021-08-06 DIAGNOSIS — L987 Excessive and redundant skin and subcutaneous tissue: Secondary | ICD-10-CM | POA: Diagnosis not present

## 2021-08-11 HISTORY — PX: ILEOSTOMY CLOSURE: SHX1784

## 2021-08-24 ENCOUNTER — Telehealth: Payer: Self-pay

## 2021-08-24 NOTE — Telephone Encounter (Signed)
-----   Message from Stevan Born, [place] sent at 08/21/2021  4:27 PM EDT ----- Regarding: FW: reminder Shemika Robbs,  This is the patient you and I talked about needing an EGD.  He  may want to try and wait until DJ is back.  Thank you for your help.  Elmyra Ricks    ----- Message ----- From: Stevan Born, CMA Sent: 08/10/2021  12:00 AM EDT To: Stevan Born, CMA Subject: reminder                                       Elmyra Ricks, can you put him for a reminder about 3 months (august 2023)from now to consider EGD.  Set a reminder and send me a message when it is due and I will get in touch with him.  Thanks

## 2021-08-25 DIAGNOSIS — Z932 Ileostomy status: Secondary | ICD-10-CM | POA: Diagnosis not present

## 2021-08-25 DIAGNOSIS — K51018 Ulcerative (chronic) pancolitis with other complication: Secondary | ICD-10-CM | POA: Diagnosis not present

## 2021-08-25 NOTE — Telephone Encounter (Signed)
Called and left message that I was following up for Dr. [place] and that I would try again to reach him - he may also call back

## 2021-08-25 NOTE — Telephone Encounter (Signed)
Dr Carlean Purl this is a pt that Dr Ardis Hughs has been following closely.  He asked that a reminder is entered to call the pt to consider EGD.  He stated he would call the pt and discuss personally with him.  Would you mind reviewing for Dr Ardis Hughs?  I know you are not DOD but wanted to see if you could review.  Thank you

## 2021-08-27 DIAGNOSIS — Z79631 Long term (current) use of antimetabolite agent: Secondary | ICD-10-CM | POA: Diagnosis not present

## 2021-08-27 DIAGNOSIS — Z432 Encounter for attention to ileostomy: Secondary | ICD-10-CM | POA: Diagnosis not present

## 2021-08-27 DIAGNOSIS — Z79899 Other long term (current) drug therapy: Secondary | ICD-10-CM | POA: Diagnosis not present

## 2021-08-27 DIAGNOSIS — Z7951 Long term (current) use of inhaled steroids: Secondary | ICD-10-CM | POA: Diagnosis not present

## 2021-08-27 NOTE — Telephone Encounter (Signed)
I called and left another message - asked him to call back and let us know how dysphagia is.

## 2021-08-28 NOTE — Telephone Encounter (Signed)
I spoke to the patient and he is not having significant dysphagia issues.  He thinks he is "about as good as I can be".  I advised him to let us know if he starts to have dysphagia such that he thinks he needs a dilation again.  He is aware that Dr. Ardis Hughs is going to be absent for a period of time and that we would be covering for him.  He is actually a personal friend/neighbor of Dr. Ardis Hughs.

## 2021-09-08 DIAGNOSIS — Z932 Ileostomy status: Secondary | ICD-10-CM | POA: Diagnosis not present

## 2021-09-08 DIAGNOSIS — G8918 Other acute postprocedural pain: Secondary | ICD-10-CM | POA: Diagnosis not present

## 2021-09-08 DIAGNOSIS — I10 Essential (primary) hypertension: Secondary | ICD-10-CM | POA: Diagnosis not present

## 2021-09-08 DIAGNOSIS — Z880 Allergy status to penicillin: Secondary | ICD-10-CM | POA: Diagnosis not present

## 2021-09-08 DIAGNOSIS — K51918 Ulcerative colitis, unspecified with other complication: Secondary | ICD-10-CM | POA: Diagnosis not present

## 2021-09-08 DIAGNOSIS — K5100 Ulcerative (chronic) pancolitis without complications: Secondary | ICD-10-CM | POA: Diagnosis not present

## 2021-09-08 DIAGNOSIS — G4730 Sleep apnea, unspecified: Secondary | ICD-10-CM | POA: Diagnosis not present

## 2021-09-08 DIAGNOSIS — Z432 Encounter for attention to ileostomy: Secondary | ICD-10-CM | POA: Diagnosis not present

## 2021-09-22 DIAGNOSIS — E039 Hypothyroidism, unspecified: Secondary | ICD-10-CM | POA: Diagnosis not present

## 2021-09-22 DIAGNOSIS — Z125 Encounter for screening for malignant neoplasm of prostate: Secondary | ICD-10-CM | POA: Diagnosis not present

## 2021-09-22 DIAGNOSIS — I10 Essential (primary) hypertension: Secondary | ICD-10-CM | POA: Diagnosis not present

## 2021-09-29 DIAGNOSIS — Z0000 Encounter for general adult medical examination without abnormal findings: Secondary | ICD-10-CM | POA: Diagnosis not present

## 2021-09-29 DIAGNOSIS — Z1339 Encounter for screening examination for other mental health and behavioral disorders: Secondary | ICD-10-CM | POA: Diagnosis not present

## 2021-09-29 DIAGNOSIS — R7301 Impaired fasting glucose: Secondary | ICD-10-CM | POA: Diagnosis not present

## 2021-09-29 DIAGNOSIS — I10 Essential (primary) hypertension: Secondary | ICD-10-CM | POA: Diagnosis not present

## 2021-09-29 DIAGNOSIS — Z1331 Encounter for screening for depression: Secondary | ICD-10-CM | POA: Diagnosis not present

## 2021-09-29 DIAGNOSIS — R82998 Other abnormal findings in urine: Secondary | ICD-10-CM | POA: Diagnosis not present

## 2021-10-08 DIAGNOSIS — K51018 Ulcerative (chronic) pancolitis with other complication: Secondary | ICD-10-CM | POA: Diagnosis not present

## 2021-11-27 ENCOUNTER — Telehealth: Payer: Self-pay | Admitting: Internal Medicine

## 2021-11-27 NOTE — Telephone Encounter (Signed)
Pt was made aware of Dr. Carlean Purl recommendations:  Pt was scheduled for an office visit on 11/30/2021 at 9:10 AM with Dr. Carlean Purl: Pt made aware Pt verbalized understanding with all questions answered.

## 2021-11-27 NOTE — Telephone Encounter (Signed)
Please call patient and arrange appointment w/ me - he has seen Dr. Ardis Hughs but needs f/u and I have spoken to him in past  He is s/p colectomy for ulcerative colitis  If he needs an appointment sooner than January please use a work-in spot or banding appointment or an 1130 or 350

## 2021-11-30 ENCOUNTER — Encounter: Payer: Self-pay | Admitting: Internal Medicine

## 2021-11-30 ENCOUNTER — Ambulatory Visit (INDEPENDENT_AMBULATORY_CARE_PROVIDER_SITE_OTHER): Payer: BC Managed Care – PPO | Admitting: Internal Medicine

## 2021-11-30 VITALS — BP 106/62 | HR 54 | Ht 69.0 in | Wt 136.0 lb

## 2021-11-30 DIAGNOSIS — K222 Esophageal obstruction: Secondary | ICD-10-CM | POA: Insufficient documentation

## 2021-11-30 DIAGNOSIS — D839 Common variable immunodeficiency, unspecified: Secondary | ICD-10-CM | POA: Diagnosis not present

## 2021-11-30 DIAGNOSIS — K51919 Ulcerative colitis, unspecified with unspecified complications: Secondary | ICD-10-CM | POA: Diagnosis not present

## 2021-11-30 NOTE — Progress Notes (Signed)
Eric Carson 51 y.o. 1970-03-27 [id number]  Assessment & Plan:   Encounter Diagnoses  Name Primary?   Ulcerative colitis with complication, unspecified location [facility]) Yes   Esophageal stricture    CVID (common variable immunodeficiency) ([place])    2 episodes of postoperative nausea and vomiting and abdominal distention after his  ileostomy takedown.  They were self-limited and have not recurred at this point.  Will monitor for recurrence.  Pouchitis could be a cause of this, he does not seem to have symptoms of that right now.  If he has recurrent problems would consider pouchoscopy otherwise would do it at a year after his surgery I think.  He is not having dysphagia to the point of requiring requesting dilation again.  We will continue to monitor.   His primary gastroenterologist at [facility], Dr. Redmond Pulling is retiring.  I told him we are certainly available to help him here and I will leave it to him if he maintains a gastroenterologist at [facility].  That is reasonable given the complexity of his medical problems.  He plans to ask Dr. Sheryn Bison for recommendation.  Regarding the reduction in need for nystatin, I cannot make a connection.  There is actually a potential increased risk of SIBO or SIFO after a colectomy so we will need to monitor for that.  That could be part of his flatulence and gas problem he is experiencing though he thinks that is getting better so would not do a breath test versus empiric treatment.  Breath testing actually would not be accurate now that his colon is gone, most likely.  CC: Ginger Organ., MD   Subjective:   Gastroenterology summary  Ulcerative colitis-Pan UC had been followed by Dr. Redmond Pulling at [facility].  Colonoscopy Dr. Ardis Hughs March 2023 with 2 areas of high-grade dysplasia.  Total proctocolectomy with J-pouch creation June 2023 [facility] Dr. Sheryn Bison.  Ileostomy placement then also.  Ileostomy takedown August 2023.  Chronic severe esophageal stricture-thought  due to chronic recurrent Candida infection probably related to common variable immune deficiency many dilations over the years last dilation [facility] May 07, 2021   Very proximal focal esophageal stricture dilated up to 12 mm today with CRE balloon. - Body of esophagus was overall narrow and had some incomplete muscular rings and overall granular appearance to the mucosa. I biopsied the esophagus to check for eosinophilic esophagitis.  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 Trichrome stain shows mild portal expansion but no periportal fibrosis. Comment: The differential diagnosis includes CVID-associated chronic hepatitis, AMA-negative PBC, small duct PSC, and other immune-mediated biliary injury. There are no specific features of PBC (no florid duct lesions) and no specific features of PSC (no periductal fibrosis). PBC is seen in about 5% of CVID patients. In addition, there are focal areas of congestion and atrophic hepatocytes associated with some sinusoidal fibrosis, which is commonly seen in CVID patients; this could be contributing to the abnormalities in LFTs.   Chief Complaint: Follow-up after colectomy for ulcerative colitis  HPI 51 year old white man, with common variable immune deficiency, inflammatory arthritis, recurrent esophageal stricture, ulcerative colitis here to to establish with me in the absence of Dr. Ardis Hughs who is on leave.  I had spoken to the patient earlier in the year for follow-up regarding his dysphagia issues related to his chronic esophageal stricture as outlined  above.  Then and now he reports that he has been tolerating food okay without significant dysphagia and does not think he needs a repeat dilation.   He is now status post his total proctocolectomy and then ileostomy  takedown as outlined above.  The day he left the hospital after ileostomy takedown and then subsequently a few weeks later he had abdominal cramps and nausea and vomiting.  Those were both very self-limited.  He reports having increased gas in the evenings, and it seems to start in the area of the ileostomy and then radiate across the abdomen.  He feels like this is improving with time.  He generally eats a low fiber diet.  Regarding defecation he was having about 15 or 20 stools initially now down to about 6 or 8 taking about 2 Imodium a day sometimes 3.  He will have some irritation and swelling in the anorectal area he says after having a formed bowel movement which occurs sometimes.  This is self-limited and not associated with bleeding or any discharge.  He has had chronic recurrent candidiasis particularly with thrush foot reports since his colectomy he has not needed to use his nystatin much at all.  He wonders if there is a relationship. Allergies  Allergen Reactions   Amoxicillin Hives    As a child    Current Meds  Medication Sig   ARTIFICIAL TEAR SOLUTION OP Place 1 drop into both eyes daily as needed (dry eyes).   doxycycline (VIBRAMYCIN) 100 MG capsule Take 100 mg by mouth daily as needed (rosacea).   folic acid (FOLVITE) 937 MCG tablet Take 800 mcg by mouth daily.   GAMUNEX-C 40 GM/400ML SOLN Inject 1 Dose as directed every 28 (twenty-eight) days.   methotrexate (RHEUMATREX) 2.5 MG tablet Take 2.5 mg by mouth once a week. Monday   mometasone (NASONEX) 50 MCG/ACT nasal spray Place 2 sprays into the nose daily.   Multiple Vitamins tablet Take 1 tablet by mouth 3 (three) times a week.   nystatin (MYCOSTATIN) 100000 UNIT/ML suspension Use as directed 5 mLs in the mouth or throat 2 (two) times daily as needed (Candida).   Current Facility-Administered Medications for the 11/30/21 encounter (Office Visit) with Gatha Mayer, MD  Medication   albuterol (VENTOLIN HFA) 108 (90 Base)  MCG/ACT inhaler 2 puff   diphenhydrAMINE (BENADRYL) injection 50 mg   EPINEPHrine (EPI-PEN) injection 0.3 mg   methylPREDNISolone sodium succinate (SOLU-MEDROL) 125 mg/2 mL injection 125 mg   Past Medical History:  Diagnosis Date   CVID (common variable immunodeficiency) ([place]) age 49   tx at 17- IVIG since age 32   Inflammatory arthritis 2007   Sinusitis    recurrent   Ulcerative colitis ([place]) 1994   Past Surgical History:  Procedure Laterality Date   BALLOON DILATION N/A 04/16/2021   Procedure: BALLOON DILATION;  Surgeon: [name], MD;  Location[facility];  Service: Gastroenterology;  Laterality: N/A;   BALLOON DILATION N/A 05/07/2021   Procedure: BALLOON DILATION;  Surgeon: [name], MD;  Location: [facility];  Service: Gastroenterology;  Laterality: N/A;   BIOPSY  04/02/2021   Procedure: BIOPSY;  Surgeon: [name], MD;  Location: [facility];  Service: Gastroenterology;;   BIOPSY  05/07/2021   Procedure: BIOPSY;  Surgeon: [name], MD;  Location: [facility];  Service: Gastroenterology;;   COLONOSCOPY WITH PROPOFOL N/A 04/02/2021   Procedure: COLONOSCOPY WITH PROPOFOL;  Surgeon: [name], MD;  Location: [facility];  Service: Gastroenterology;  Laterality: N/A;   ESOPHAGOGASTRODUODENOSCOPY (EGD) WITH PROPOFOL N/A 04/02/2021   Procedure: ESOPHAGOGASTRODUODENOSCOPY (EGD) WITH PROPOFOL;  Surgeon: [name], MD;  Location: [facility];  Service: Gastroenterology;  Laterality: N/A;   ESOPHAGOGASTRODUODENOSCOPY (EGD) WITH PROPOFOL N/A 04/16/2021   Procedure: ESOPHAGOGASTRODUODENOSCOPY (EGD) WITH PROPOFOL;  Surgeon: [name], MD;  Location: [facility];  Service: Gastroenterology;  Laterality: N/A;   ESOPHAGOGASTRODUODENOSCOPY (EGD) WITH PROPOFOL N/A 05/07/2021   Procedure: ESOPHAGOGASTRODUODENOSCOPY (EGD) WITH PROPOFOL;  Surgeon: [name], MD;  Location: [facility];  Service: Gastroenterology;  Laterality: N/A;   Dilation   Ileostomy takedown  09/2021   With J-pouch   LYMPH NODE DISSECTION  01/11/1981   SUBMUCOSAL TATTOO INJECTION  04/02/2021   Procedure: SUBMUCOSAL TATTOO INJECTION;  Surgeon: [name], MD;  Location: [facility];  Service: Gastroenterology;;   TOTAL COLECTOMY  2023   WISDOM TOOTH EXTRACTION  01/12/1987   Social History   Social History Narrative   Lives at home with wife, children   Caffeine use- soda 5 weekly   family history includes Alzheimer's disease in his paternal grandfather; Arthritis in his mother; Cancer - Colon in his maternal grandmother; Lupus in his paternal aunt; Lymphoma in his mother.   Review of Systems As per HPI  Objective:   Physical Exam BP 106/62   Pulse (!) 54   Ht 5' 9"  (1.753 m)   Wt 136 lb (61.7 kg)   BMI 20.08 kg/m  Thin well-developed white man in no acute distress Lungs are clear Heart sounds are normal S1-S2 no rubs murmurs or gallops The abdomen is scaphoid soft there is a freshly healed ileostomy scar in the right upper quadrant and lower midline scar from proctocolectomy.  Bowel sounds are present and this is nontender.

## 2021-11-30 NOTE — Patient Instructions (Signed)
Due to recent changes in healthcare laws, you may see the results of your imaging and laboratory studies on MyChart before your provider has had a chance to review them.  We understand that in some cases there may be results that are confusing or concerning to you. Not all laboratory results come back in the same time frame and the provider may be waiting for multiple results in order to interpret others.  Please give [place] 48 hours in order for your provider to thoroughly review all the results before contacting the office for clarification of your results.    We have put you in our system for a flex sig recall for 06/2022.   I appreciate the opportunity to care for you. Eric Carson

## 2022-01-13 ENCOUNTER — Encounter: Payer: Self-pay | Admitting: Internal Medicine

## 2022-01-13 NOTE — Telephone Encounter (Signed)
Left message for pt to call back  °

## 2022-01-13 NOTE — Telephone Encounter (Signed)
Spoke with pt in regard to my chart message that was sent below: Pt stated that he has made Dr. Brigitte Pulse aware of the side effects and they were wondering is the reason that he had these side effects due to pt not having a colon. Pt stated that he has taking the medication numerous times in the past and never had an issue. Pt stated that  himself  and Dr. Brigitte Pulse was also wondering if there are certain antibiotics that might work better since he does not have a colon and are there antibiotics that they should steer away from. Pt was notified that Dr. Carlean Purl will be out of the office the rest of the week: Please advise

## 2022-03-04 DIAGNOSIS — R7989 Other specified abnormal findings of blood chemistry: Secondary | ICD-10-CM | POA: Diagnosis not present

## 2022-03-31 ENCOUNTER — Encounter: Payer: Self-pay | Admitting: Internal Medicine

## 2022-03-31 ENCOUNTER — Other Ambulatory Visit: Payer: Self-pay | Admitting: Internal Medicine

## 2022-03-31 MED ORDER — NYSTATIN 100000 UNIT/ML MT SUSP: 5.0000 mL | Freq: Two times a day (BID) | OROMUCOSAL | 5 refills | Status: DC | PRN

## 2022-08-29 ENCOUNTER — Telehealth: Payer: Self-pay | Admitting: Internal Medicine

## 2022-09-02 NOTE — Telephone Encounter (Signed)
Left detailed message for patient & asked that he give [place] a call back if/when he is ready to schedule a follow up appointment with Dr. Leone Payor. Office number provided.

## 2022-09-02 NOTE — Telephone Encounter (Signed)
Please contact patient and arrange f/u visit me next available to discuss f/u of IBD and possible pouchoscopy exam

## 2022-09-07 NOTE — Telephone Encounter (Signed)
Left message for pt to call back  °

## 2022-09-08 NOTE — Telephone Encounter (Signed)
Left message for pt to call back  °

## 2022-09-09 NOTE — Telephone Encounter (Signed)
Letter mailed home & mychart message sent to patient.

## 2022-10-19 DIAGNOSIS — E039 Hypothyroidism, unspecified: Secondary | ICD-10-CM | POA: Diagnosis not present

## 2022-10-19 DIAGNOSIS — R7301 Impaired fasting glucose: Secondary | ICD-10-CM | POA: Diagnosis not present

## 2022-10-19 DIAGNOSIS — Z1389 Encounter for screening for other disorder: Secondary | ICD-10-CM | POA: Diagnosis not present

## 2022-10-19 DIAGNOSIS — I10 Essential (primary) hypertension: Secondary | ICD-10-CM | POA: Diagnosis not present

## 2022-10-27 DIAGNOSIS — M459 Ankylosing spondylitis of unspecified sites in spine: Secondary | ICD-10-CM | POA: Diagnosis not present

## 2022-10-27 DIAGNOSIS — Z1331 Encounter for screening for depression: Secondary | ICD-10-CM | POA: Diagnosis not present

## 2022-10-27 DIAGNOSIS — Z0000 Encounter for general adult medical examination without abnormal findings: Secondary | ICD-10-CM | POA: Diagnosis not present

## 2022-10-27 DIAGNOSIS — Z23 Encounter for immunization: Secondary | ICD-10-CM | POA: Diagnosis not present

## 2022-10-27 DIAGNOSIS — I10 Essential (primary) hypertension: Secondary | ICD-10-CM | POA: Diagnosis not present

## 2022-10-27 DIAGNOSIS — Z1339 Encounter for screening examination for other mental health and behavioral disorders: Secondary | ICD-10-CM | POA: Diagnosis not present

## 2022-10-27 DIAGNOSIS — R82998 Other abnormal findings in urine: Secondary | ICD-10-CM | POA: Diagnosis not present

## 2022-12-03 DIAGNOSIS — R0981 Nasal congestion: Secondary | ICD-10-CM | POA: Diagnosis not present

## 2022-12-03 DIAGNOSIS — U071 COVID-19: Secondary | ICD-10-CM | POA: Diagnosis not present

## 2022-12-03 DIAGNOSIS — R051 Acute cough: Secondary | ICD-10-CM | POA: Diagnosis not present

## 2022-12-16 ENCOUNTER — Encounter: Payer: Self-pay | Admitting: Internal Medicine

## 2022-12-16 ENCOUNTER — Ambulatory Visit (INDEPENDENT_AMBULATORY_CARE_PROVIDER_SITE_OTHER): Payer: BC Managed Care – PPO | Admitting: Internal Medicine

## 2022-12-16 VITALS — BP 130/70 | HR 51 | Ht 69.0 in | Wt 151.0 lb

## 2022-12-16 DIAGNOSIS — Z9049 Acquired absence of other specified parts of digestive tract: Secondary | ICD-10-CM | POA: Diagnosis not present

## 2022-12-16 DIAGNOSIS — K51919 Ulcerative colitis, unspecified with unspecified complications: Secondary | ICD-10-CM

## 2022-12-16 DIAGNOSIS — K222 Esophageal obstruction: Secondary | ICD-10-CM | POA: Diagnosis not present

## 2022-12-16 DIAGNOSIS — D839 Common variable immunodeficiency, unspecified: Secondary | ICD-10-CM | POA: Diagnosis not present

## 2022-12-16 NOTE — Patient Instructions (Signed)
You have been scheduled for an endoscopy and flex sigmoidoscopy.Please follow the written instructions given to you at your visit today.  Please pick up your prep supplies at the pharmacy within the next 1-3 days.  If you use inhalers (even only as needed), please bring them with you on the day of your procedure.  DO NOT TAKE 7 DAYS PRIOR TO TEST- Trulicity (dulaglutide) Ozempic, Wegovy (semaglutide) Mounjaro (tirzepatide) Bydureon Bcise (exanatide extended release)  DO NOT TAKE 1 DAY PRIOR TO YOUR TEST Rybelsus (semaglutide) Adlyxin (lixisenatide) Victoza (liraglutide) Byetta (exanatide) ___________________________________________________________________________   _______________________________________________________  If your blood pressure at your visit was 140/90 or greater, please contact your primary care physician to follow up on this.  _______________________________________________________  If you are age 55 or older, your body mass index should be between 23-30. Your Body mass index is 22.3 kg/m. If this is out of the aforementioned range listed, please consider follow up with your Primary Care Provider.  If you are age 60 or younger, your body mass index should be between 19-25. Your Body mass index is 22.3 kg/m. If this is out of the aformentioned range listed, please consider follow up with your Primary Care Provider.   ________________________________________________________  The [facility] providers would like to encourage you to use [facility] to communicate with providers for non-urgent requests or questions.  Due to long hold times on the telephone, sending your provider a message by [facility] may be a faster and more efficient way to get a response.  Please allow 48 business hours for a response.  Please remember that this is for non-urgent requests.  _______________________________________________________   I appreciate the opportunity to care for you. Stan Head, MD

## 2022-12-16 NOTE — Progress Notes (Signed)
Eric Carson 52 y.o. [birth year] [id number]  Assessment & Plan:   Encounter Diagnoses  Name Primary?   Ulcerative colitis with complication, unspecified location (HCC) Yes   S/P total colectomy and ileoanal pouch-2023    Esophageal stricture    CVID (common variable immunodeficiency) (HCC)    He is doing well status post his surgery for 4 complicated ulcerative colitis.  Baseline pouchoscopy will be scheduled at the hospital.  Given his complicated esophageal stricture history we feel its appropriate to reassess with endoscopy and consider dilation to avoid deterioration.  Will also be interested to see the status since he reports less nystatin requirements since colectomy.  This is the reason for scheduling procedure at the hospital, he has a history of very severe esophageal strictures and may require the ultraslim gastroscope.  The risks and benefits as well as alternatives of endoscopic procedure(s) have been discussed and reviewed. All questions answered. The patient agrees to proceed.  Eric Boop, MD, Eric Carson  CC: Eric Carson., MD    Subjective:  Gastroenterology summary   Ulcerative colitis-Pan UC had been followed by Dr. Andrey Carson [facility].  Colonoscopy Dr. Christella Carson March 2023 with 2 areas of high-grade dysplasia.  Total proctocolectomy with J-pouch creation June 2023 [facility] Dr. Abigail Carson.  Ileostomy placement then also.  Ileostomy takedown August 2023.   Chronic severe esophageal stricture-thought due to chronic recurrent Candida infection probably related to common variable immune deficiency many dilations over the years last dilation [facility] May 07, 2021 then again 05/07/21 as below:    Very proximal focal esophageal stricture dilated up to 12 mm today with CRE balloon. - Body of esophagus was overall narrow and had some incomplete muscular rings and overall granular appearance to the mucosa. I biopsied the esophagus to check for eosinophilic esophagitis.   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 Trichrome stain shows mild portal expansion but no periportal fibrosis. Comment: The differential diagnosis includes CVID-associated chronic hepatitis, AMA-negative PBC, small duct PSC, and other immune-mediated biliary injury. There are no specific features of PBC (no florid duct lesions) and no specific features of PSC (no periductal fibrosis). PBC is seen in about 5% of CVID patients. In addition, there are focal areas of congestion and atrophic hepatocytes associated with some sinusoidal fibrosis, which is commonly seen in CVID patients; this could be contributing to the abnormalities in LFTs.    [facility] liver f/u 03/04/22 - plan f/u 02/2023 and monitor labs   Common Variable Immune deficiency, Ankylosing spondylitis - followed by [facility]umat[facility]  ---------------------------------------------------------------------------------------------  Chief Complaint: pouchoscopy?  HPI Discussed the use of AI scribe software for clinical note transcription with the patient, who gave verbal consent to proceed.  History of Present Illness   The patient, who underwent a colectomy with ileoanal pouch creation last year, reports a generally good recovery, albeit with intermittent setbacks. They describe their bowel movements as averaging four times a day, with consistency varying from solid to liquid. They deny any incontinence or rectal bleeding. However, they report a postprandial urge to defecate, which can cause discomfort and inconvenience, leading them to occasionally skip meals to avoid this sensation.  The patient also has a history of esophageal stricture, which has been managed with dilations in the past. They report no current swallowing difficulties, but acknowledge  that they chew  their food more carefully than most people. They have not experienced any food impaction.  Additionally, the patient has a history of recurrent oral thrush, which has improved significantly since the colectomy. They now only require nystatin approximately once a week, compared to a couple of times a week previously. The patient also mentions a high intake of carbonated drinks, which they believe may contribute to their frequent gas.      Wt Readings from Last 3 Encounters:  12/16/22 151 lb (68.5 kg)  11/30/21 136 lb (61.7 kg)  05/07/21 160 lb (72.6 kg)   CBC at primary care 10/25/2022 hemoglobin 13.3 hematocrit 41 platelets 138 white count 5.1-all normal TSH slightly high at 5.260 but T4 free was 1.17 and normal Alkaline phosphatase continues to be elevated at 231, AST 28 ALT 16 bilirubin 0.5.  Creatinine 1.09 BUN 12 GFR 82.  Other electrolytes normal.  Albumin 4.1.  Total protein 6.4.  Allergies  Allergen Reactions   Amoxicillin Hives    As a child    Current Meds  Medication Sig   ARTIFICIAL TEAR SOLUTION OP Place 1 drop into both eyes daily as needed (dry eyes).   doxycycline (VIBRAMYCIN) 100 MG capsule Take 100 mg by mouth daily as needed (rosacea).   GAMUNEX-C 40 GM/400ML SOLN Inject 1 Dose as directed every 28 (twenty-eight) days.   mometasone (NASONEX) 50 MCG/ACT nasal spray Place 2 sprays into the nose daily.   Multiple Vitamins tablet Take 1 tablet by mouth 3 (three) times a week.   nystatin (MYCOSTATIN) 100000 UNIT/ML suspension Use as directed 5 mLs (500,000 Units total) in the mouth or throat 2 (two) times daily as needed (Candida).   Current Facility-Administered Medications for the 12/16/22 encounter (Office Visit) with Eric Boop, MD  Medication   albuterol (VENTOLIN HFA) 108 (90 Base) MCG/ACT inhaler 2 puff   diphenhydrAMINE (BENADRYL) injection 50 mg   EPINEPHrine (EPI-PEN) injection 0.3 mg   methylPREDNISolone sodium succinate (SOLU-MEDROL) 125  mg/2 mL injection 125 mg   Past Medical History:  Diagnosis Date   CVID (common variable immunodeficiency) (HCC) age 81   tx at [facility]- IVIG since age 58   Esophageal stricture    Inflammatory arthritis 01/11/2005   Sinusitis    recurrent   Ulcerative colitis (HCC) 01/12/1992   Past Surgical History:  Procedure Laterality Date   ABDOMINOPERINEAL PROCTOCOLECTOMY  05/2021   BALLOON DILATION N/A 04/16/2021   Procedure: BALLOON DILATION;  Surgeon: Rachael Fee, MD;  Location[facility];  Service: Gastroenterology;  Laterality: N/A;   BALLOON DILATION N/A 05/07/2021   Procedure: BALLOON DILATION;  Surgeon: Rachael Fee, MD;  Location: [facility];  Service: Gastroenterology;  Laterality: N/A;   BIOPSY  04/02/2021   Procedure: BIOPSY;  Surgeon: Rachael Fee, MD;  Location: [facility];  Service: Gastroenterology;;   BIOPSY  05/07/2021   Procedure: BIOPSY;  Surgeon: Rachael Fee, MD;  Location: [facility];  Service: Gastroenterology;;   COLONOSCOPY WITH PROPOFOL N/A 04/02/2021   Procedure: COLONOSCOPY WITH PROPOFOL;  Surgeon: Rachael Fee, MD;  Location: [facility];  Service: Gastroenterology;  Laterality: N/A;   ESOPHAGOGASTRODUODENOSCOPY (EGD) WITH PROPOFOL N/A 04/02/2021   Procedure: ESOPHAGOGASTRODUODENOSCOPY (EGD) WITH PROPOFOL;  Surgeon: Rachael Fee, MD;  [facility];  Service: Gastroenterology;  Laterality: N/A;   ESOPHAGOGASTRODUODENOSCOPY (EGD) WITH PROPOFOL N/A 04/16/2021   Procedure: ESOPHAGOGASTRODUODENOSCOPY (EGD) WITH PROPOFOL;  Surgeon: Rachael Fee, MD;  Location: [facility];  Service: Gastroenterology;  Laterality: N/A;   ESOPHAGOGASTRODUODENOSCOPY (  EGD) WITH PROPOFOL N/A 05/07/2021   Procedure: ESOPHAGOGASTRODUODENOSCOPY (EGD) WITH PROPOFOL;  Surgeon: Rachael Fee, MD;  Location: [facility];  Service: Gastroenterology;  Laterality: N/A;  Dilation   ILEOSTOMY CLOSURE  08/2021   LYMPH NODE DISSECTION  01/11/1981    SUBMUCOSAL TATTOO INJECTION  04/02/2021   Procedure: SUBMUCOSAL TATTOO INJECTION;  Surgeon: Rachael Fee, MD;  Location: [facility];  Service: Gastroenterology;;   TOTAL COLECTOMY  2023   WISDOM TOOTH EXTRACTION  01/12/1987   Social History   Social History Narrative   Lives at home with wife, children   Caffeine use- soda 5 weekly   family history includes Alzheimer's disease in his paternal grandfather; Arthritis in his mother; Cancer - Colon in his maternal grandmother; Lupus in his paternal aunt; Lymphoma in his mother.   Review of Systems As perHPI  Objective:   Physical Exam @BP  130/70   Pulse (!) 51   Ht 5\' 9"  (1.753 m)   Wt 151 lb (68.5 kg)   BMI 22.30 kg/m @  General:  NAD Eyes:   anicteric Lungs:  clear Heart::  S1S2 no rubs, murmurs or gallops Abdomen:  soft and nontender, BS+ Ext:   no edema, cyanosis or clubbing    Data Reviewed:  See HPI

## 2023-01-18 ENCOUNTER — Encounter (HOSPITAL_COMMUNITY): Payer: Self-pay | Admitting: Internal Medicine

## 2023-01-18 NOTE — Progress Notes (Signed)
 Attempted to obtain medical history for pre op call via telephone, unable to reach at this time. HIPAA compliant voicemail message left requesting return call to pre surgical testing department.

## 2023-01-24 ENCOUNTER — Encounter: Payer: Self-pay | Admitting: Internal Medicine

## 2023-01-26 ENCOUNTER — Encounter (HOSPITAL_COMMUNITY): Payer: Self-pay | Admitting: Anesthesiology

## 2023-01-26 ENCOUNTER — Telehealth: Payer: Self-pay

## 2023-01-26 NOTE — Anesthesia Preprocedure Evaluation (Signed)
 Anesthesia Evaluation    Reviewed: Allergy & Precautions, Patient's Chart, lab work & pertinent test results  Airway        Dental  (+)    Pulmonary COPD,  COPD inhaler          Cardiovascular      Neuro/Psych negative neurological ROS  negative psych ROS   GI/Hepatic Neg liver ROS, PUD,,,Ulcerative Colitis    Endo/Other  negative endocrine ROS    Renal/GU negative Renal ROS     Musculoskeletal  (+) Arthritis , Rheumatoid disorders,    Abdominal   Peds negative pediatric ROS (+)  Hematology negative hematology ROS (+)   Anesthesia Other Findings All: amoxicillin  Reproductive/Obstetrics                              Anesthesia Physical Anesthesia Plan  ASA: 3  Anesthesia Plan: MAC   Post-op Pain Management: Minimal or no pain anticipated   Induction:   PONV Risk Score and Plan: Treatment may vary due to age or medical condition and Propofol  infusion  Airway Management Planned: Natural Airway and Simple Face Mask  Additional Equipment: None  Intra-op Plan:   Post-operative Plan:   Informed Consent:      Dental advisory given  Plan Discussed with: CRNA and Anesthesiologist  Anesthesia Plan Comments: (UC and esophageal stricture for EGD + flex sig)         Anesthesia Quick Evaluation

## 2023-01-26 NOTE — Telephone Encounter (Signed)
 Pt made aware of Dr. [name] recommendations: Scheduling Contacted and procedure was canceled. Pt was notified that we would contact him in a few days and get him rescheduled.  Pt verbalized understanding with all questions answered.

## 2023-01-26 NOTE — Telephone Encounter (Signed)
 January 26, 2023 Me     01/26/23 12:19 PM Note Pt made aware of Dr. [name] recommendations: Scheduling Contacted and procedure was canceled. Pt was notified that we would contact him in a few days and get him rescheduled.  Pt verbalized understanding with all questions answered.        Kenney Peacemaker, MD to [name] "Eric Carson"      01/26/23 11:57 AM Eric Carson I think the safest thing to do is to cancel the procedures for tomorrow and reschedule.  My staff will be reaching out to you.   CEG  Last read by Annabell Key" at 12:02 PM on 01/26/2023.    JB   01/26/23  9:29 AM Anthonette Kinsman, RN routed this conversation to Kenney Peacemaker, MD    [name] "Eric Carson" to [facility] (supporting Kenney Peacemaker, MD)      01/26/23  9:01 AM Feeling about the same this morning.still a lingering cough with some chest congestion and a tiny bit of a sore throat   Let me know how you want to proceed.    Thanks   Eric Carson

## 2023-01-27 ENCOUNTER — Encounter (HOSPITAL_COMMUNITY): Payer: Self-pay | Admitting: Anesthesiology

## 2023-01-27 ENCOUNTER — Ambulatory Visit (HOSPITAL_COMMUNITY)
Admission: RE | Admit: 2023-01-27 | Payer: BC Managed Care – PPO | Source: Home / Self Care | Admitting: Internal Medicine

## 2023-01-27 SURGERY — ESOPHAGOGASTRODUODENOSCOPY (EGD) WITH PROPOFOL
Anesthesia: Monitor Anesthesia Care

## 2023-01-28 NOTE — Telephone Encounter (Signed)
Left message for pt to call back. Dr. Leone Payor does have availability on 02/24/2023 at [facility]

## 2023-01-31 NOTE — Telephone Encounter (Signed)
Left message for pt to call back  °

## 2023-01-31 NOTE — Telephone Encounter (Signed)
Spoke with Eric Carson. Eric Carson was notified that Dr. Leone Payor has availability on 02/24/2023 and 03/17/2023. Eric Carson stated that he would ask his wife which date worked for her and that he would call us back or send a message via my chart.

## 2023-01-31 NOTE — Telephone Encounter (Signed)
Patient returning call.

## 2023-02-01 ENCOUNTER — Other Ambulatory Visit: Payer: Self-pay

## 2023-02-01 DIAGNOSIS — K222 Esophageal obstruction: Secondary | ICD-10-CM

## 2023-02-01 DIAGNOSIS — K51919 Ulcerative colitis, unspecified with unspecified complications: Secondary | ICD-10-CM

## 2023-02-01 NOTE — Telephone Encounter (Signed)
Pt sent in my chart message stating that he was good for 02/24/2023 for the procedure. Pt was rescheduled to 02/24/2023 with Dr. Leone Payor at [facility] at 9:30 AM.  Pt to arrive at 8:00 AM Ambulatory referral to GI placed in EPIC. Pt made aware.  Updated Prep instructions were sent to pt. Pt made aware.  Pt was notified that the prep instructions stated that the procedure is at [facility]. Pt  was made aware that the procedure is to be done at [facility]. Pt was asked he he wanted updated instructions to reflect this but pt denied it. Pt verbalized understanding with all questions answered.

## 2023-02-01 NOTE — Telephone Encounter (Signed)
Spoke with Pt.  Documented in phone note. Pt verbalized understanding with all questions answered.

## 2023-02-23 NOTE — Anesthesia Preprocedure Evaluation (Addendum)
Anesthesia Evaluation  Patient identified by MRN, date of birth, ID band Patient awake    Reviewed: Allergy & Precautions, NPO status , Patient's Chart, lab work & pertinent test results  Airway Mallampati: II  TM Distance: >3 FB Neck ROM: Full    Dental no notable dental hx. (+) Teeth Intact, Dental Advisory Given,    Pulmonary neg pulmonary ROS   Pulmonary exam normal breath sounds clear to auscultation       Cardiovascular Normal cardiovascular exam Rhythm:Regular Rate:Normal     Neuro/Psych negative neurological ROS  negative psych ROS   GI/Hepatic Neg liver ROS,,,Ulcerative Colitis    Endo/Other  negative endocrine ROS    Renal/GU negative Renal ROS     Musculoskeletal  (+) Arthritis ,    Abdominal   Peds negative pediatric ROS (+)  Hematology negative hematology ROS (+)   Anesthesia Other Findings All: amoxicillin  Reproductive/Obstetrics                             Anesthesia Physical Anesthesia Plan  ASA: 3  Anesthesia Plan: MAC   Post-op Pain Management: Minimal or no pain anticipated   Induction:   PONV Risk Score and Plan: Propofol infusion and Treatment may vary due to age or medical condition  Airway Management Planned: Natural Airway and Nasal Cannula  Additional Equipment: None  Intra-op Plan:   Post-operative Plan:   Informed Consent: I have reviewed the patients History and Physical, chart, labs and discussed the procedure including the risks, benefits and alternatives for the proposed anesthesia with the patient or authorized representative who has indicated his/her understanding and acceptance.     Dental advisory given  Plan Discussed with: CRNA and Anesthesiologist  Anesthesia Plan Comments: (Egd for UC and esophageal stricture)        Anesthesia Quick Evaluation

## 2023-02-24 ENCOUNTER — Ambulatory Visit (HOSPITAL_COMMUNITY): Payer: Self-pay | Admitting: Anesthesiology

## 2023-02-24 ENCOUNTER — Encounter (HOSPITAL_COMMUNITY)
Admission: RE | Disposition: A | Discharge status: Home / Self Care | Payer: Self-pay | Source: Home / Self Care | Attending: Internal Medicine

## 2023-02-24 ENCOUNTER — Other Ambulatory Visit: Payer: Self-pay

## 2023-02-24 ENCOUNTER — Ambulatory Visit (HOSPITAL_COMMUNITY)
Admission: RE | Admit: 2023-02-24 | Discharge: 2023-02-24 | Disposition: A | Discharge status: Home / Self Care | Payer: BC Managed Care – PPO | Attending: Internal Medicine | Admitting: Internal Medicine

## 2023-02-24 ENCOUNTER — Encounter (HOSPITAL_COMMUNITY): Payer: Self-pay | Admitting: Internal Medicine

## 2023-02-24 DIAGNOSIS — K51919 Ulcerative colitis, unspecified with unspecified complications: Secondary | ICD-10-CM

## 2023-02-24 DIAGNOSIS — Z9049 Acquired absence of other specified parts of digestive tract: Secondary | ICD-10-CM | POA: Diagnosis not present

## 2023-02-24 DIAGNOSIS — B3781 Candidal esophagitis: Secondary | ICD-10-CM | POA: Diagnosis not present

## 2023-02-24 DIAGNOSIS — Z980 Intestinal bypass and anastomosis status: Secondary | ICD-10-CM | POA: Diagnosis not present

## 2023-02-24 DIAGNOSIS — K6389 Other specified diseases of intestine: Secondary | ICD-10-CM | POA: Insufficient documentation

## 2023-02-24 DIAGNOSIS — R1310 Dysphagia, unspecified: Secondary | ICD-10-CM | POA: Insufficient documentation

## 2023-02-24 DIAGNOSIS — K222 Esophageal obstruction: Secondary | ICD-10-CM | POA: Diagnosis not present

## 2023-02-24 HISTORY — DX: Family history of other specified conditions: Z84.89

## 2023-02-24 HISTORY — PX: POUCHOSCOPY: SHX6321

## 2023-02-24 HISTORY — PX: BIOPSY: SHX5522

## 2023-02-24 HISTORY — PX: ESOPHAGOGASTRODUODENOSCOPY (EGD) WITH PROPOFOL: SHX5813

## 2023-02-24 SURGERY — Surgical Case
Anesthesia: *Unknown

## 2023-02-24 SURGERY — ESOPHAGOGASTRODUODENOSCOPY (EGD) WITH PROPOFOL
Anesthesia: Monitor Anesthesia Care

## 2023-02-24 MED ORDER — GLYCOPYRROLATE 0.2 MG/ML IJ SOLN
INTRAMUSCULAR | Status: DC | PRN
  Administered 2023-02-24: .1 mg via INTRAVENOUS

## 2023-02-24 MED ORDER — PROPOFOL 500 MG/50ML IV EMUL
INTRAVENOUS | Status: DC | PRN
  Administered 2023-02-24: 50 mg via INTRAVENOUS
  Administered 2023-02-24: 100 ug/kg/min via INTRAVENOUS

## 2023-02-24 MED ORDER — FLUCONAZOLE 100 MG PO TABS: ORAL_TABLET | ORAL | 0 refills | Status: AC

## 2023-02-24 MED ORDER — SODIUM CHLORIDE 0.9 % IV SOLN: INTRAVENOUS | Status: DC

## 2023-02-24 MED ORDER — LIDOCAINE 2% (20 MG/ML) 5 ML SYRINGE
INTRAMUSCULAR | Status: DC | PRN
  Administered 2023-02-24: 50 mg via INTRAVENOUS

## 2023-02-24 MED ORDER — MIDAZOLAM HCL 2 MG/2ML IJ SOLN
INTRAMUSCULAR | Status: DC | PRN
  Administered 2023-02-24: 2 mg via INTRAVENOUS

## 2023-02-24 MED ORDER — MIDAZOLAM HCL 2 MG/2ML IJ SOLN
INTRAMUSCULAR | Status: AC
  Filled 2023-02-24: qty 2

## 2023-02-24 SURGICAL SUPPLY — 14 items

## 2023-02-24 NOTE — Anesthesia Postprocedure Evaluation (Signed)
Anesthesia Post Note  Patient: Eric Carson  Procedure(s) Performed: ESOPHAGOGASTRODUODENOSCOPY (EGD) WITH PROPOFOL POUCHOSCOPY Balloon dilation wire-guided BIOPSY     Patient location during evaluation: Endoscopy Anesthesia Type: MAC Level of consciousness: awake and alert Pain management: pain level controlled Vital Signs Assessment: post-procedure vital signs reviewed and stable Respiratory status: spontaneous breathing, nonlabored ventilation, respiratory function stable and patient connected to nasal cannula oxygen Cardiovascular status: blood pressure returned to baseline and stable Postop Assessment: no apparent nausea or vomiting Anesthetic complications: no  No notable events documented.  Last Vitals:  Vitals:   02/24/23 1000 02/24/23 1010  BP: 117/82 119/79  Pulse: 80 72  Resp: 20 20  Temp:    SpO2: 100% 100%    Last Pain:  Vitals:   02/24/23 1010  TempSrc:   PainSc: 0-No pain                 Trevor Iha

## 2023-02-24 NOTE — Transfer of Care (Signed)
Immediate Anesthesia Transfer of Care Note  Patient: Eric Carson  Procedure(s) Performed: ESOPHAGOGASTRODUODENOSCOPY (EGD) WITH PROPOFOL POUCHOSCOPY Balloon dilation wire-guided BIOPSY  Patient Location: PACU and Endoscopy Unit  Anesthesia Type:MAC  Level of Consciousness: drowsy  Airway & Oxygen Therapy: Patient Spontanous Breathing  Post-op Assessment: Report given to RN and Post -op Vital signs reviewed and stable  Post vital signs: Reviewed and stable  Last Vitals:  Vitals Value Taken Time  BP 102/70 02/24/23 0948  Temp 36.8 C 02/24/23 0948  Pulse 80 02/24/23 1000  Resp 20 02/24/23 1000  SpO2 100 % 02/24/23 1000  Vitals shown include unfiled device data.  Last Pain:  Vitals:   02/24/23 0948  TempSrc: Temporal  PainSc:          Complications: No notable events documented.

## 2023-02-24 NOTE — Op Note (Signed)
[facility] Patient Name: Eric Carson Procedure Date : 02/24/2023 MRN: [id number] Attending MD: Iva Boop , MD, [id number] Date of Birth: Mar 07, 1970 CSN: [id number] Age: 53 Admit Type: Outpatient Procedure:                Pouchoscopy Indications:              History of total colectomy Providers:                Iva Boop, MD, Fransisca Connors, Rozetta Nunnery, Technician Referring MD:              Medicines:                Monitored Anesthesia Care Complications:            No immediate complications. Estimated Blood Loss:     Estimated blood loss was minimal. Procedure:                Pre-Anesthesia Assessment:                           - Prior to the procedure, a History and Physical                            was performed, and patient medications and                            allergies were reviewed. The patient's tolerance of                            previous anesthesia was also reviewed. The risks                            and benefits of the procedure and the sedation                            options and risks were discussed with the patient.                            All questions were answered, and informed consent                            was obtained. Prior Anticoagulants: The patient has                            taken no anticoagulant or antiplatelet agents. ASA                            Grade Assessment: III - A patient with severe                            systemic disease. After reviewing the risks and  benefits, the patient was deemed in satisfactory                            condition to undergo the procedure.                           After obtaining informed consent, the endoscope was                            passed under direct vision. Throughout the                            procedure, the patient's blood pressure, pulse, and                            oxygen  saturations were monitored continuously. The                            GIF-H190 ([id number]) Olympus endoscope was introduced                            through the anus and advanced to the the J-pouch.                            After obtaining informed consent, the endoscope was                            passed under direct vision. Throughout the                            procedure, the patient's blood pressure, pulse, and                            oxygen saturations were monitored continuously.The                            procedure was performed without difficulty. The                            patient tolerated the procedure well. The quality                            of the bowel preparation was good. Scope In: Scope Out: Findings:      Patient is status-post total colectomy with an ileal pouch-anal       anastomosis.      The perianal and digital rectal examinations were normal.      The ileoanal pouch appeared normal. Biopsies were taken with a cold       forceps for histology. Verification of patient identification for the       specimen was done. Estimated blood loss was minimal. Impression:               - The ileoanal pouch is normal. Biopsied. Recommendation:           - Patient has a contact number available for  emergencies. The signs and symptoms of potential                            delayed complications were discussed with the                            patient. Return to normal activities tomorrow.                            Written discharge instructions were provided to the                            patient.                           - I will follow-up biopsies.                           See upper endoscopy report for other recommendations Procedure Code(s):        --- Professional ---                           [phone], Endoscopic evaluation of small intestinal                            pouch (eg, Kock pouch, ileal reservoir [S or  J]);                            with biopsy, single or multiple Diagnosis Code(s):        --- Professional ---                           Z90.49, Acquired absence of other specified parts                            of digestive tract CPT copyright 2022 American Medical Association. All rights reserved. The codes documented in this report are preliminary and upon coder review may  be revised to meet current compliance requirements. Iva Boop, MD 02/24/2023 10:14:07 AM This report has been signed electronically. Number of Addenda: 0

## 2023-02-24 NOTE — Op Note (Signed)
[facility] Patient Name: Eric Carson Procedure Date : 02/24/2023 MRN: [id number] Attending MD: Iva Boop , MD, [id number] Date of Birth: 04/21/1970 CSN: [id number] Age: 53 Admit Type: Outpatient Procedure:                Upper GI endoscopy Indications:              Dysphagia, Stricture of the esophagus, For therapy                            of esophageal stricture Providers:                Iva Boop, MD, Fransisca Connors, Rozetta Nunnery, Technician Referring MD:              Medicines:                Monitored Anesthesia Care Complications:            No immediate complications. Estimated Blood Loss:     Estimated blood loss was minimal. Procedure:                Pre-Anesthesia Assessment:                           - Prior to the procedure, a History and Physical                            was performed, and patient medications and                            allergies were reviewed. The patient's tolerance of                            previous anesthesia was also reviewed. The risks                            and benefits of the procedure and the sedation                            options and risks were discussed with the patient.                            All questions were answered, and informed consent                            was obtained. Prior Anticoagulants: The patient has                            taken no anticoagulant or antiplatelet agents. ASA                            Grade Assessment: III - A patient with severe  systemic disease. After reviewing the risks and                            benefits, the patient was deemed in satisfactory                            condition to undergo the procedure.                           After obtaining informed consent, the endoscope was                            passed under direct vision. Throughout the                            procedure,  the patient's blood pressure, pulse, and                            oxygen saturations were monitored continuously. The                            GIF-H190 ([id number]) Olympus endoscope was introduced                            through the mouth, and advanced to the upper third                            of esophagus. The upper GI endoscopy was somewhat                            difficult due to stricture. The patient tolerated                            the procedure well. Scope In: Scope Out: Findings:      A few benign-appearing, intrinsic severe (stenosis; an endoscope cannot       pass) stenoses were found in the proximal esophagus. it was difficult to       intubate due to tortuous upper esophagus as described before. I did use       ultraslim scope after failure with standard gastroscope and then       intubated with standard gastroscope going to right side of       cricopharyngeus. The narrowest stenosis measured 7 mm (inner diameter).       The stenoses were not traversed. A TTS dilator was passed through the       scope. Dilation with an 08-19-08 mm balloon dilator was performed to 10       mm. The dilation site was examined and showed moderate mucosal       disruption. Estimated blood loss was minimal. Scope would not pass.      Diffuse, white plaques were found in the upper third of the esophagus       and in the middle third of the esophagus. Impression:               - Benign-appearing esophageal stenoses. Dilated.                           -  Esophageal plaques were found, consistent with                            candidiasis.                           - No specimens collected. Recommendation:           - Patient has a contact number available for                            emergencies. The signs and symptoms of potential                            delayed complications were discussed with the                            patient. Return to normal activities tomorrow.                             Written discharge instructions were provided to the                            patient.                           - Clear liquids x 1 hour then soft foods rest of                            day. Start prior diet tomorrow.                           - Continue present medications.                           - Anticipate repeat and try to get dilation to 12                            mm which was maximum in 2023                           I will discuss with him when I review patology from                            pouchoscopy                           Will treat w/ 2 weeks fluconazole due to Candida                            seen Procedure Code(s):        --- Professional ---                           43220, Esophagoscopy, flexible, transoral; with  transendoscopic balloon dilation (less than 30 mm                            diameter) Diagnosis Code(s):        --- Professional ---                           K22.2, Esophageal obstruction                           K22.9, Disease of esophagus, unspecified                           R13.10, Dysphagia, unspecified CPT copyright 2022 American Medical Association. All rights reserved. The codes documented in this report are preliminary and upon coder review may  be revised to meet current compliance requirements. Iva Boop, MD 02/24/2023 10:07:55 AM This report has been signed electronically. Number of Addenda: 0

## 2023-02-24 NOTE — H&P (Addendum)
[facility] History and Physical   Primary Care Physician:  [name]., MD   Reason for Procedure:   Esophageal stricture/dysphagia and evaluate pouch after colectomy for ulcerative colitis  Plan:    EGD + esophageal dilation and pouchoscopy     HPI: Eric Carson is a 53 y.o. 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 He is here for reassessment of esophagus + dilation (has some dysphagia) and baseline pouchoscopy.   Past Medical History:  Diagnosis Date   CVID (common variable immunodeficiency) (HCC) age 27   tx at [facility]- [facility] since age 53   Esophageal stricture    Family history of adverse reaction to anesthesia    Son had trouble waking up after wisdom tooth extraction. Pt has never had issues personally.   Inflammatory arthritis 01/11/2005   Sinusitis    recurrent   Ulcerative colitis (HCC) 01/12/1992    Past Surgical History:  Procedure Laterality Date   ABDOMINOPERINEAL PROCTOCOLECTOMY  05/2021   BALLOON DILATION N/A 04/16/2021   Procedure: BALLOON DILATION;  Surgeon: Rachael Fee, MD;  Location: [facility];  Service: Gastroenterology;  Laterality: N/A;   BALLOON DILATION N/A 05/07/2021   Procedure: BALLOON DILATION;  Surgeon: Rachael Fee, MD;  Location: [facility];  Service: Gastroenterology;  Laterality: N/A;   BIOPSY  04/02/2021   Procedure: BIOPSY;  Surgeon: Rachael Fee, MD;  Location: [facility];  Service: Gastroenterology;;   BIOPSY  05/07/2021   Procedure: BIOPSY;  Surgeon: Rachael Fee, MD;  Location: [facility];  Service: Gastroenterology;;   COLONOSCOPY WITH PROPOFOL N/A 04/02/2021   Procedure: COLONOSCOPY WITH PROPOFOL;  Surgeon: Rachael Fee, MD;  Location: [facility];  Service: Gastroenterology;  Laterality: N/A;   ESOPHAGOGASTRODUODENOSCOPY (EGD) WITH PROPOFOL N/A 04/02/2021   Procedure: ESOPHAGOGASTRODUODENOSCOPY (EGD)  WITH PROPOFOL;  Surgeon: Rachael Fee, MD;  Location: [facility];  Service: Gastroenterology;  Laterality: N/A;   ESOPHAGOGASTRODUODENOSCOPY (EGD) WITH PROPOFOL N/A 04/16/2021   Procedure: ESOPHAGOGASTRODUODENOSCOPY (EGD) WITH PROPOFOL;  Surgeon: Rachael Fee, MD;  Location: [facility];  Service: Gastroenterology;  Laterality: N/A;   ESOPHAGOGASTRODUODENOSCOPY (EGD) WITH PROPOFOL N/A 05/07/2021   Procedure: ESOPHAGOGASTRODUODENOSCOPY (EGD) WITH PROPOFOL;  Surgeon: Rachael Fee, MD;  Location: [facility];  Service: Gastroenterology;  Laterality: N/A;  Dilation   ILEOSTOMY CLOSURE  08/2021   LYMPH NODE DISSECTION  01/11/1981   SUBMUCOSAL TATTOO INJECTION  04/02/2021   Procedure: SUBMUCOSAL TATTOO INJECTION;  Surgeon: Rachael Fee, MD;  Location: [facility];  Service: Gastroenterology;;   TOTAL COLECTOMY  2023   WISDOM TOOTH EXTRACTION  01/12/1987    Prior to Admission medications   Medication Sig Start Date End Date Taking? Authorizing Provider  ARTIFICIAL TEAR SOLUTION OP Place 1 drop into both eyes daily as needed (dry eyes).   Yes [provider]  doxycycline (VIBRAMYCIN) 100 MG capsule Take 100 mg by mouth daily as needed (rosacea).   Yes [provider]  GAMUNEX-C 40 GM/400ML SOLN Inject 1 Dose as directed every 28 (twenty-eight) days. 04/03/21  Yes [provider]  mometasone (NASONEX) 50 MCG/ACT nasal spray Place 2 sprays into the nose daily. 03/04/09  Yes [provider]  Multiple Vitamins tablet Take 1 tablet by mouth 3 (three) times a week.   Yes [provider]  nystatin (MYCOSTATIN) 100000 UNIT/ML suspension Use as directed 5 mLs (500,000 Units total) in the mouth or throat 2 (two) times daily  as needed (Candida). 03/31/22  Yes Iva Boop, MD    Current Facility-Administered Medications  Medication Dose Route Frequency Provider Last Rate Last Admin   0.9 %  sodium chloride infusion   Intravenous Continuous  Iva Boop, MD 20 mL/hr at 02/24/23 0859 New Bag at 02/24/23 0859    Allergies as of 02/01/2023 - Review Complete 01/18/2023  Allergen Reaction Noted   Amoxicillin Hives 04/14/2021    Family History  Problem Relation Age of Onset   Lymphoma Mother    Arthritis Mother    Cancer - Colon Maternal Grandmother    Alzheimer's disease Paternal Grandfather    Lupus Paternal Aunt    Esophageal cancer Neg Hx    Stomach cancer Neg Hx     Social History   Socioeconomic History   Marital status: Married    Spouse name: Marcelino Duster   Number of children: 2   Years of education: 16   Highest education level: Not on file  Occupational History    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  Tobacco Use   Smoking status: Never   Smokeless tobacco: Never  Vaping Use   Vaping status: Never Used  Substance and Sexual Activity   Alcohol use: Yes    Alcohol/week: 0.0 standard drinks of alcohol    Comment: social  < 1 day   Drug use: No   Sexual activity: Not on file  Other Topics Concern   Not on file  Social History Narrative   Lives at home with wife, children   Caffeine use- soda 5 weekly   Social Drivers of Health   Financial Resource Strain: Not on file  Food Insecurity: No Food Insecurity (02/03/2021)   Received from [facility], [facility]   Hunger Vital Sign    Worried About Running Out of Food in the Last Year: Never true    Ran Out of Food in the Last Year: Never true  Transportation Needs: No Transportation Needs (02/03/2021)   Received from [facility], [facility]   [facility] - Transportation    In the past 12 months, has lack of transportation kept you from medical appointments or from getting medications?: No    Lack of Transportation (Non-Medical): No  Physical Activity: Not on file  Stress: Not on file  Social Connections: Not on file  Intimate Partner Violence: Not on file    Review of Systems:  All  other review of systems negative except as mentioned in the HPI.  Physical Exam: Vital signs BP (!) 140/92   Pulse 80   Temp 98.6 F (37 C) (Temporal)   Resp 17   Ht 5\' 9"  (1.753 m)   Wt 68 kg   SpO2 100%   BMI 22.15 kg/m   General:   Alert,  Well-developed, well-nourished, pleasant and cooperative in NAD Lungs:  Clear throughout to auscultation.   Heart:  Regular rate and rhythm; no murmurs, clicks, rubs,  or gallops. Abdomen:  Soft, nontender and nondistended. Normal bowel sounds.   Neuro/Psych:  Alert and cooperative. Normal mood and affect. A and O x 3   @Halee Glynn  Sena Slate, MD, [facility] [phone] (pager) 02/24/2023 9:05 AM@

## 2023-02-24 NOTE — Discharge Instructions (Signed)
I dilated the stricture to 10 mm. It was 12 mm maximum before. I anticipate we should schedule another and try to get to that level. I will call with pouch biopsy results and we can discuss. Pouch looks fine - I took some baseline biopsies.  YOU HAD AN ENDOSCOPIC PROCEDURE TODAY: Refer to the procedure report and other information in the discharge instructions given to you for any specific questions about what was found during the examination. If this information does not answer your questions, please call Dr. Marvell Fuller office at [phone] to clarify.   YOU SHOULD EXPECT: Some feelings of bloating in the abdomen. Passage of more gas than usual. Walking can help get rid of the air that was put into your GI tract during the procedure and reduce the bloating. If you had a lower endoscopy (such as a colonoscopy or flexible sigmoidoscopy) you may notice spotting of blood in your stool or on the toilet paper. Some abdominal soreness may be present for a day or two, also.  DIET: Clear liquids only until 11 AM today then try soft foods. Your typical foods regarding consistency may be tried tomorrow if feeling ok.. Drink plenty of fluids but you should avoid alcoholic beverages for 24 hours.   ACTIVITY: Your care partner should take you home directly after the procedure. You should plan to take it easy, moving slowly for the rest of the day. You can resume normal activity the day after the procedure however YOU SHOULD NOT DRIVE, use power tools, machinery or perform tasks that involve climbing or major physical exertion for 24 hours (because of the sedation medicines used during the test).   SYMPTOMS TO REPORT IMMEDIATELY: A gastroenterologist can be reached at any hour. Please call [phone]  for any of the following symptoms:  Following lower endoscopy (colonoscopy, flexible sigmoidoscopy) Excessive amounts of blood in the stool  Significant tenderness, worsening of abdominal pains  Swelling of the  abdomen that is new, acute  Fever of 100 or higher  Following upper endoscopy (EGD, EUS, ERCP, esophageal dilation) Vomiting of blood or coffee ground material  New, significant abdominal pain  New, significant chest pain or pain under the shoulder blades  Painful or persistently difficult swallowing  New shortness of breath  Black, tarry-looking or red, bloody stools  FOLLOW UP:  If any biopsies were taken you will be contacted by phone or by letter within the next 1-3 weeks. Call [phone]  if you have not heard about the biopsies in 3 weeks.  Please also call with any specific questions about appointments or follow up tests.

## 2023-02-25 LAB — SURGICAL PATHOLOGY

## 2023-02-28 ENCOUNTER — Encounter (HOSPITAL_COMMUNITY): Payer: Self-pay | Admitting: Internal Medicine

## 2023-03-01 ENCOUNTER — Encounter: Payer: Self-pay | Admitting: Internal Medicine

## 2023-03-03 ENCOUNTER — Telehealth: Payer: Self-pay | Admitting: Internal Medicine

## 2023-03-03 NOTE — Telephone Encounter (Signed)
Please contact patient and explain I think he should set up an EGD at hospital (with balloon dilation) again unless he thinks swallowing is so much better that he wants to wait longer  Go ahead and schedule if he is willing  Let me know if ?'s

## 2023-03-04 NOTE — Telephone Encounter (Signed)
 Left message for patient to call back

## 2023-03-07 ENCOUNTER — Other Ambulatory Visit: Payer: Self-pay

## 2023-03-07 DIAGNOSIS — K222 Esophageal obstruction: Secondary | ICD-10-CM

## 2023-03-07 DIAGNOSIS — R1310 Dysphagia, unspecified: Secondary | ICD-10-CM

## 2023-03-07 NOTE — Telephone Encounter (Signed)
 Left message for pt to call back

## 2023-03-07 NOTE — Telephone Encounter (Signed)
 Pt made aware of Dr. Leone Payor recommendations: Pt requested to be scheduled for the EGD with Dilation.  Pt was scheduled for 05/19/2023 at [facility]. Start time at 8:15. Arrival time 6:45 AM. Pt made aware.  Case ID [id number]. Prep instructions were sent to pt via my chart. Pt made aware.  Ambulatory referral to GI Placed in Epic, Pt verbalized understanding with all questions answered.

## 2023-03-22 DIAGNOSIS — K589 Irritable bowel syndrome without diarrhea: Secondary | ICD-10-CM | POA: Diagnosis not present

## 2023-03-22 DIAGNOSIS — Z79899 Other long term (current) drug therapy: Secondary | ICD-10-CM | POA: Diagnosis not present

## 2023-03-22 DIAGNOSIS — M1990 Unspecified osteoarthritis, unspecified site: Secondary | ICD-10-CM | POA: Diagnosis not present

## 2023-03-22 DIAGNOSIS — D839 Common variable immunodeficiency, unspecified: Secondary | ICD-10-CM | POA: Diagnosis not present

## 2023-03-22 DIAGNOSIS — M25511 Pain in right shoulder: Secondary | ICD-10-CM | POA: Diagnosis not present

## 2023-03-22 DIAGNOSIS — M4690 Unspecified inflammatory spondylopathy, site unspecified: Secondary | ICD-10-CM | POA: Diagnosis not present

## 2023-03-22 DIAGNOSIS — M79674 Pain in right toe(s): Secondary | ICD-10-CM | POA: Diagnosis not present

## 2023-03-25 DIAGNOSIS — M778 Other enthesopathies, not elsewhere classified: Secondary | ICD-10-CM | POA: Diagnosis not present

## 2023-03-25 DIAGNOSIS — M4690 Unspecified inflammatory spondylopathy, site unspecified: Secondary | ICD-10-CM | POA: Diagnosis not present

## 2023-03-25 DIAGNOSIS — M25461 Effusion, right knee: Secondary | ICD-10-CM | POA: Diagnosis not present

## 2023-04-30 ENCOUNTER — Other Ambulatory Visit: Payer: Self-pay | Admitting: Internal Medicine

## 2023-05-02 DIAGNOSIS — Z79810 Long term (current) use of selective estrogen receptor modulators (SERMs): Secondary | ICD-10-CM | POA: Diagnosis not present

## 2023-05-02 DIAGNOSIS — Z5181 Encounter for therapeutic drug level monitoring: Secondary | ICD-10-CM | POA: Diagnosis not present

## 2023-05-02 NOTE — Telephone Encounter (Signed)
 Left message for him to call us  back. I need to know if he requested this or was it automatically sent from the pharmacy.

## 2023-05-03 NOTE — Telephone Encounter (Signed)
 Patient returned the call. He did need the mycostatin  refilled. Said he has to use it periodically. He would like to get a slightly larger bottle if that is okay. 60 ml's does not last very long.

## 2023-05-12 ENCOUNTER — Encounter (HOSPITAL_COMMUNITY): Payer: Self-pay | Admitting: Internal Medicine

## 2023-05-12 ENCOUNTER — Telehealth: Payer: Self-pay | Admitting: Gastroenterology

## 2023-05-12 NOTE — Progress Notes (Signed)
 Attempted to obtain medical history for pre op call via telephone, unable to reach at this time. HIPAA compliant voicemail message left requesting return call to pre surgical testing department.

## 2023-05-12 NOTE — Telephone Encounter (Addendum)
 Procedure:Endoscopy Procedure date: 05/19/23 Procedure location: [facility] Arrival Time: 6:45 am Spoke with the patient Y/N:   No, I left a detailed message for the patient to return call  05/12/23 @ 9:52 am No, I left a detailed message 05/13/23 @ 1:03 pm for the patient to return call Mychart message sent 05/16/23 @ 1:18 pm  Any prep concerns? ___  Has the patient obtained the prep from the pharmacy ? ___ Do you have a care partner and transportation: ___ Any additional concerns? ___

## 2023-05-16 ENCOUNTER — Telehealth: Payer: Self-pay

## 2023-05-16 ENCOUNTER — Encounter: Payer: Self-pay | Admitting: Internal Medicine

## 2023-05-16 NOTE — Telephone Encounter (Signed)
 I left Eric Carson a detailed message to double check with him to see if he could possibly find another ride before I cancel his EGD for 05/19/2023. The hospital cases are limited in number, we could possibly do it 06/30/2023 if they let me add one on.I don't have the 07/26/2023 date open yet.

## 2023-05-17 NOTE — Telephone Encounter (Signed)
 I spoke with Polly Brink and he has found a driver.

## 2023-05-19 ENCOUNTER — Ambulatory Visit (HOSPITAL_COMMUNITY)
Admission: RE | Admit: 2023-05-19 | Discharge: 2023-05-19 | Disposition: A | Discharge status: Home / Self Care | Payer: BC Managed Care – PPO | Attending: Internal Medicine | Admitting: Internal Medicine

## 2023-05-19 ENCOUNTER — Ambulatory Visit (HOSPITAL_COMMUNITY): Payer: Self-pay

## 2023-05-19 ENCOUNTER — Encounter (HOSPITAL_COMMUNITY)
Admission: RE | Disposition: A | Discharge status: Home / Self Care | Payer: Self-pay | Source: Home / Self Care | Attending: Internal Medicine

## 2023-05-19 ENCOUNTER — Encounter (HOSPITAL_COMMUNITY): Payer: Self-pay | Admitting: Internal Medicine

## 2023-05-19 ENCOUNTER — Other Ambulatory Visit: Payer: Self-pay

## 2023-05-19 DIAGNOSIS — Z8711 Personal history of peptic ulcer disease: Secondary | ICD-10-CM | POA: Insufficient documentation

## 2023-05-19 DIAGNOSIS — Z9049 Acquired absence of other specified parts of digestive tract: Secondary | ICD-10-CM | POA: Diagnosis not present

## 2023-05-19 DIAGNOSIS — B3781 Candidal esophagitis: Secondary | ICD-10-CM

## 2023-05-19 DIAGNOSIS — R1310 Dysphagia, unspecified: Secondary | ICD-10-CM | POA: Insufficient documentation

## 2023-05-19 DIAGNOSIS — D839 Common variable immunodeficiency, unspecified: Secondary | ICD-10-CM | POA: Diagnosis not present

## 2023-05-19 DIAGNOSIS — Q398 Other congenital malformations of esophagus: Secondary | ICD-10-CM | POA: Diagnosis not present

## 2023-05-19 DIAGNOSIS — K2289 Other specified disease of esophagus: Secondary | ICD-10-CM | POA: Diagnosis not present

## 2023-05-19 DIAGNOSIS — Z8619 Personal history of other infectious and parasitic diseases: Secondary | ICD-10-CM | POA: Insufficient documentation

## 2023-05-19 DIAGNOSIS — K222 Esophageal obstruction: Secondary | ICD-10-CM | POA: Diagnosis not present

## 2023-05-19 DIAGNOSIS — Z8719 Personal history of other diseases of the digestive system: Secondary | ICD-10-CM | POA: Diagnosis not present

## 2023-05-19 HISTORY — PX: ESOPHAGOGASTRODUODENOSCOPY (EGD) WITH PROPOFOL: SHX5813

## 2023-05-19 HISTORY — PX: BALLOON DILATION: SHX5330

## 2023-05-19 SURGERY — ESOPHAGOGASTRODUODENOSCOPY (EGD) WITH PROPOFOL
Anesthesia: Monitor Anesthesia Care

## 2023-05-19 MED ORDER — LIDOCAINE 2% (20 MG/ML) 5 ML SYRINGE
INTRAMUSCULAR | Status: DC | PRN
  Administered 2023-05-19: 80 mg via INTRAVENOUS

## 2023-05-19 MED ORDER — PROPOFOL 500 MG/50ML IV EMUL
INTRAVENOUS | Status: AC
Start: 2023-05-19 — End: ?
  Filled 2023-05-19: qty 50

## 2023-05-19 MED ORDER — SODIUM CHLORIDE 0.9 % IV SOLN
INTRAVENOUS | Status: AC | PRN
  Administered 2023-05-19: 500 mL via INTRAMUSCULAR

## 2023-05-19 MED ORDER — PROPOFOL 500 MG/50ML IV EMUL
INTRAVENOUS | Status: DC | PRN
  Administered 2023-05-19: 110 mg via INTRAVENOUS
  Administered 2023-05-19: 50 mg via INTRAVENOUS
  Administered 2023-05-19 (×2): 40 mg via INTRAVENOUS
  Administered 2023-05-19: 30 mg via INTRAVENOUS

## 2023-05-19 MED ORDER — PROPOFOL 500 MG/50ML IV EMUL
INTRAVENOUS | Status: AC
  Filled 2023-05-19: qty 50

## 2023-05-19 MED ORDER — SODIUM CHLORIDE 0.9 % IV SOLN: INTRAVENOUS | Status: DC

## 2023-05-19 MED ORDER — FLUCONAZOLE 100 MG PO TABS
ORAL_TABLET | ORAL | 0 refills | Status: AC
Start: 2023-05-19 — End: ?

## 2023-05-19 MED ORDER — GLYCOPYRROLATE PF 0.2 MG/ML IJ SOSY
PREFILLED_SYRINGE | INTRAMUSCULAR | Status: DC | PRN
  Administered 2023-05-19: .2 mg via INTRAVENOUS

## 2023-05-19 SURGICAL SUPPLY — 13 items
BLOCK BITE 60FR ADLT L/F BLUE (MISCELLANEOUS) ×1 IMPLANT
ELECTRODE REM PT RTRN 9FT ADLT (ELECTROSURGICAL) IMPLANT
FORCEP RJ3 GP 1.8X160 W-NEEDLE (CUTTING FORCEPS) IMPLANT
FORCEPS BIOP RAD 4 LRG CAP 4 (CUTTING FORCEPS) IMPLANT
NDL SCLEROTHERAPY 25GX240 (NEEDLE) IMPLANT
NEEDLE SCLEROTHERAPY 25GX240 (NEEDLE) IMPLANT
PROBE APC STR FIRE (PROBE) IMPLANT
PROBE INJECTION GOLD 7FR (MISCELLANEOUS) IMPLANT
SNARE SHORT THROW 13M SML OVAL (MISCELLANEOUS) IMPLANT
SYR 50ML LL SCALE MARK (SYRINGE) IMPLANT
TUBING ENDO SMARTCAP PENTAX (MISCELLANEOUS) ×2 IMPLANT
TUBING IRRIGATION ENDOGATOR (MISCELLANEOUS) ×1 IMPLANT
WATER STERILE IRR 1000ML POUR (IV SOLUTION) IMPLANT

## 2023-05-19 NOTE — Op Note (Signed)
 [facility] Patient Name: Eric Carson Procedure Date: 05/19/2023 MRN: [id number] Attending MD: Kenney Peacemaker , MD, [id number] Date of Birth: 16-Jan-1970 CSN: [id number] Age: 53 Admit Type: Outpatient Procedure:                Upper GI endoscopy Indications:              Dysphagia, Stricture of the esophagus, For therapy                            of esophageal stricture                           Dysphagia; Chronic esophageal stricture, dilated                            numerous times at [facility], last [facility] was around                            2015. EGD 04/02/21 Dr. Howard Macho found focal, severe                            (3mm lumen) stricture in very proximal esophagus;                            dilated to 8mm using CRE balloon. The                            hypopharynx/most proximal aspect of the esophagus                            was quite tortuous and so just getting to the                            stricture was somewhat challenging. Swallowing much                            improved. EGD 04/16/2021 Dr. Howard Macho, same stricture                            noted, dilated to 10mm with CRE balloon. 05/07/21                            dilated to 12 mm. 02/24/23 [name]) dilated to 10                            mm and Candida treated. dysphagia improved but                            perststent. Providers:                Kenney Peacemaker, MD, Suzann Ernst, RN, Judith Novak,                            Technician Referring MD:  Medicines:                Monitored Anesthesia Care Complications:            No immediate complications. Estimated Blood Loss:     Estimated blood loss was minimal. Procedure:                Pre-Anesthesia Assessment:                           - Prior to the procedure, a History and Physical                            was performed, and patient medications and                            allergies were reviewed. The patient's  tolerance of                            previous anesthesia was also reviewed. The risks                            and benefits of the procedure and the sedation                            options and risks were discussed with the patient.                            All questions were answered, and informed consent                            was obtained. Prior Anticoagulants: The patient has                            taken no anticoagulant or antiplatelet agents. ASA                            Grade Assessment: II - A patient with mild systemic                            disease. After reviewing the risks and benefits,                            the patient was deemed in satisfactory condition to                            undergo the procedure.                           After obtaining informed consent, the endoscope was                            passed under direct vision. Throughout the  procedure, the patient's blood pressure, pulse, and                            oxygen saturations were monitored continuously. The                            [id number]-H190 ([id number]) Olympus endoscope was introduced                            through the mouth, and advanced to the lower third                            of esophagus. The upper GI endoscopy was                            technically difficult and complex due to abnormal                            anatomy. The intubation of the esophagus again                            difficult due to tortuosity of hypopharynx/UES and                            proximal esophagus - able to intubate on right                            side. The patient tolerated the procedure well. Scope In: Scope Out: Findings:      Multiple benign-appearing, intrinsic severe (stenosis; an endoscope       cannot pass) stenoses were found in the proximal esophagus. The       narrowest stenosis measured 9 mm (inner diameter). The stenoses were        traversed after dilation. A TTS dilator was passed through the scope.       Dilation with a 10-22-10 mm balloon dilator was performed to 12 mm. The       dilation site was examined and showed moderate mucosal disruption.       Estimated blood loss was minimal. Scope advanced through stricture but       there was resistance felt when in distal esophagus so did not advance       further.      Diffuse, white plaques were found in the mid esophagus and in the distal       esophagus. Impression:               - Benign-appearing esophageal stenoses. Dilated.                            Proximal esophageal strictures in patient w/ common                            variable dficiency. Dilated to 12 mm today. Scope                            passed to distal  esophagus.                           - Esophageal plaques were found, consistent with                            candidiasis.                           - No specimens collected. Moderate Sedation:      Not Applicable - Patient had care per Anesthesia. Recommendation:           - Patient has a contact number available for                            emergencies. The signs and symptoms of potential                            delayed complications were discussed with the                            patient. Return to normal activities tomorrow.                            Written discharge instructions were provided to the                            patient.                           - Clear liquids x 1 hour then soft foods rest of                            day. Start prior diet tomorrow.                           - Continue present medications.                           - Fluconazole  Rx x 2 weeks                           Repeat dilation prn likely but recall EGD 1 year                            also Procedure Code(s):        --- Professional ---                           [phone], Esophagoscopy, flexible, transoral; with                             transendoscopic balloon dilation (less than 30 mm                            diameter) Diagnosis Code(s):        --- Professional ---  K22.2, Esophageal obstruction                           K22.9, Disease of esophagus, unspecified                           R13.10, Dysphagia, unspecified CPT copyright 2022 American Medical Association. All rights reserved. The codes documented in this report are preliminary and upon coder review may  be revised to meet current compliance requirements. Kenney Peacemaker, MD 05/19/2023 8:48:44 AM This report has been signed electronically. Number of Addenda: 0

## 2023-05-19 NOTE — Anesthesia Postprocedure Evaluation (Signed)
 Anesthesia Post Note  Patient: Eric Carson  Procedure(s) Performed: ESOPHAGOGASTRODUODENOSCOPY (EGD) WITH PROPOFOL  BALLOON DILATION     Patient location during evaluation: PACU Anesthesia Type: MAC Level of consciousness: awake and alert Pain management: pain level controlled Vital Signs Assessment: post-procedure vital signs reviewed and stable Respiratory status: spontaneous breathing, nonlabored ventilation, respiratory function stable and patient connected to nasal cannula oxygen Cardiovascular status: stable and blood pressure returned to baseline Postop Assessment: no apparent nausea or vomiting Anesthetic complications: no   No notable events documented.  Last Vitals:  Vitals:   05/19/23 0850 05/19/23 0900  BP: 134/78 134/72  Pulse: 72 78  Resp: (!) 21 (!) 24  Temp:    SpO2: 100% 100%    Last Pain:  Vitals:   05/19/23 0900  TempSrc:   PainSc: 0-No pain                 Leslye Rast

## 2023-05-19 NOTE — Discharge Instructions (Signed)

## 2023-05-19 NOTE — Transfer of Care (Signed)
 Immediate Anesthesia Transfer of Care Note  Patient: Eric Carson  Procedure(s) Performed: ESOPHAGOGASTRODUODENOSCOPY (EGD) WITH PROPOFOL  BALLOON DILATION  Patient Location: PACU  Anesthesia Type:MAC  Level of Consciousness: awake  Airway & Oxygen Therapy: Patient Spontanous Breathing  Post-op Assessment: Report given to RN and Post -op Vital signs reviewed and stable  Post vital signs: Reviewed and stable  Last Vitals:  Vitals Value Taken Time  BP    Temp    Pulse 86 05/19/23 0838  Resp 16 05/19/23 0838  SpO2 98 % 05/19/23 0838  Vitals shown include unfiled device data.  Last Pain:  Vitals:   05/19/23 0704  TempSrc: Temporal  PainSc: 0-No pain         Complications: No notable events documented.

## 2023-05-19 NOTE — H&P (Signed)
 [facility] History and Physical   Primary Care Physician:  Jeannine Milroy., MD   Reason for Procedure:   Dilate esophageal stricture  Plan:    EGD and balloon dilation     HPI: Eric Carson is a 53 y.o. 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 He is here for reassessment of esophagus + dilation  Had dilation to 10 mm 02/24/2023 and is improved but still some dysphagia. Also had Candida treated w/ fluconazole .   Past Medical History:  Diagnosis Date   CVID (common variable immunodeficiency) (HCC) age 89   tx at [facility]- [facility] since age 44   Esophageal stricture    Family history of adverse reaction to anesthesia    Son had trouble waking up after wisdom tooth extraction. Pt has never had issues personally.   Inflammatory arthritis 01/11/2005   Sinusitis    recurrent   Ulcerative colitis (HCC) 01/12/1992    Past Surgical History:  Procedure Laterality Date   ABDOMINOPERINEAL PROCTOCOLECTOMY  05/2021   BALLOON DILATION N/A 04/16/2021   Procedure: BALLOON DILATION;  Surgeon: Janel Medford, MD;  Location: [facility] ENDOSCOPY;  Service: Gastroenterology;  Laterality: N/A;   BALLOON DILATION N/A 05/07/2021   Procedure: BALLOON DILATION;  Surgeon: Janel Medford, MD;  Location: [facility];  Service: Gastroenterology;  Laterality: N/A;   BIOPSY  04/02/2021   Procedure: BIOPSY;  Surgeon: Janel Medford, MD;  Location: [facility];  Service: Gastroenterology;;   BIOPSY  05/07/2021   Procedure: BIOPSY;  Surgeon: Janel Medford, MD;  Location: [facility];  Service: Gastroenterology;;   BIOPSY  02/24/2023   Procedure: BIOPSY;  Surgeon: Kenney Peacemaker, MD;  Location: [facility] ENDOSCOPY;  Service: Gastroenterology;;   COLONOSCOPY WITH PROPOFOL  N/A 04/02/2021   Procedure: COLONOSCOPY WITH PROPOFOL ;  Surgeon: Janel Medford, MD;  Location: [facility] ENDOSCOPY;  Service: Gastroenterology;  Laterality:  N/A;   ESOPHAGOGASTRODUODENOSCOPY (EGD) WITH PROPOFOL  N/A 04/02/2021   Procedure: ESOPHAGOGASTRODUODENOSCOPY (EGD) WITH PROPOFOL ;  Surgeon: Janel Medford, MD;  Location: [facility] ENDOSCOPY;  Service: Gastroenterology;  Laterality: N/A;   ESOPHAGOGASTRODUODENOSCOPY (EGD) WITH PROPOFOL  N/A 04/16/2021   Procedure: ESOPHAGOGASTRODUODENOSCOPY (EGD) WITH PROPOFOL ;  Surgeon: Janel Medford, MD;  Location: [facility] ENDOSCOPY;  Service: Gastroenterology;  Laterality: N/A;   ESOPHAGOGASTRODUODENOSCOPY (EGD) WITH PROPOFOL  N/A 05/07/2021   Procedure: ESOPHAGOGASTRODUODENOSCOPY (EGD) WITH PROPOFOL ;  Surgeon: Janel Medford, MD;  Location: [facility]OSCOPY;  Service: Gastroenterology;  Laterality: N/A;  Dilation   ESOPHAGOGASTRODUODENOSCOPY (EGD) WITH PROPOFOL  N/A 02/24/2023   Procedure: ESOPHAGOGASTRODUODENOSCOPY (EGD) WITH PROPOFOL ;  Surgeon: Kenney Peacemaker, MD;  Location: [facility] ENDOSCOPY;  Service: Gastroenterology;  Laterality: N/A;   ILEOSTOMY CLOSURE  08/2021   LYMPH NODE DISSECTION  01/11/1981   POUCHOSCOPY N/A 02/24/2023   Procedure: POUCHOSCOPY;  Surgeon: Kenney Peacemaker, MD;  Location: [facility] ENDOSCOPY;  Service: Gastroenterology;  Laterality: N/A;   SUBMUCOSAL TATTOO INJECTION  04/02/2021   Procedure: SUBMUCOSAL TATTOO INJECTION;  Surgeon: Janel Medford, MD;  Location: [facility];  Service: Gastroenterology;;   TOTAL COLECTOMY  2023   WISDOM TOOTH EXTRACTION  01/12/1987    Prior to Admission medications   Medication Sig Start Date End Date Taking? Authorizing Provider  ARTIFICIAL TEAR SOLUTION OP Place 1 drop into both eyes daily as needed (dry eyes).   Yes [provider]  doxycycline (VIBRAMYCIN) 100 MG capsule Take 100 mg by mouth daily as needed (rosacea).   Yes [provider]  GAMUNEX-C 40 GM/400ML SOLN Inject 1 Dose as directed every 28 (twenty-eight) days. 04/03/21  Yes [provider]  mometasone (NASONEX) 50 MCG/ACT nasal spray Place 2 sprays into the nose daily. 03/04/09   Yes [provider]  Multiple Vitamins tablet Take 1 tablet by mouth 3 (three) times a week.   Yes [provider]  nystatin  (MYCOSTATIN ) 100000 UNIT/ML suspension USE AS DIRECTED IN MOUTH OR THROAT 2 TIMES DAILY AS NEEDED 05/03/23  Yes Kenney Peacemaker, MD    Current Facility-Administered Medications  Medication Dose Route Frequency Provider Last Rate Last Admin   0.9 %  sodium chloride  infusion   Intravenous Continuous Kenney Peacemaker, MD       0.9 %  sodium chloride  infusion    Continuous PRN Kenney Peacemaker, MD   500 mL at 05/19/23 2130    Allergies as of 03/07/2023 - Review Complete 02/24/2023  Allergen Reaction Noted   Ceftin [cefuroxime] Diarrhea and Nausea And Vomiting 02/24/2023   Amoxicillin Hives 04/14/2021    Family History  Problem Relation Age of Onset   Lymphoma Mother    Arthritis Mother    Cancer - Colon Maternal Grandmother    Alzheimer's disease Paternal Grandfather    Lupus Paternal Aunt    Esophageal cancer Neg Hx    Stomach cancer Neg Hx     Social History   Socioeconomic History   Marital status: Married    Spouse name: Moira Andrews   Number of children: 2   Years of education: 16   Highest education level: Not on file  Occupational History    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  Tobacco Use   Smoking status: Never   Smokeless tobacco: Never  Vaping Use   Vaping status: Never Used  Substance and Sexual Activity   Alcohol  use: Yes    Alcohol /week: 0.0 standard drinks of alcohol     Comment: social  < 1 day   Drug use: No   Sexual activity: Not on file  Other Topics Concern   Not on file  Social History Narrative   Lives at home with wife, children   Caffeine use- soda 5 weekly   Social Drivers of Corporate investment banker Strain: Not on file  Food Insecurity: No Food Insecurity (03/22/2023)   Received from [facility]   Hunger Vital Sign    Worried About Running Out of Food in the Last Year: Never true     Ran Out of Food in the Last Year: Never true  Transportation Needs: No Transportation Needs (03/22/2023)   Received from [facility]    In the past 12 months, has lack of transportation kept you from medical appointments or from getting medications?: No    Lack of Transportation (Non-Medical): No  Physical Activity: Not on file  Stress: Not on file  Social Connections: Not on file  Intimate Partner Violence: Not on file    Review of Systems:  All other review of systems negative except as mentioned in the HPI.  Physical Exam: Vital signs BP (!) 156/73   Pulse 80   Temp 98.5 F (36.9 C) (Temporal)   Resp 19   Ht 5\' 9"  (1.753 m)   Wt 68 kg   SpO2 100%   BMI 22.14 kg/m   General:   Alert,  Well-developed, well-nourished, pleasant and cooperative in NAD Lungs:  Clear throughout to auscultation.   Heart:  Regular rate  and rhythm; no murmurs, clicks, rubs,  or gallops. Abdomen:  Soft, nontender and nondistended. Normal bowel sounds.   Neuro/Psych:  Alert and cooperative. Normal mood and affect. A and O x 3   @Essense Bousquet  Tammie Fall, MD, [facility] [phone] (pager) 05/19/2023 8:13 AM@

## 2023-05-19 NOTE — Anesthesia Preprocedure Evaluation (Signed)
 Anesthesia Evaluation  Patient identified by MRN, date of birth, ID band Patient awake    Reviewed: Allergy & Precautions, NPO status , Patient's Chart, lab work & pertinent test results, reviewed documented beta blocker date and time   History of Anesthesia Complications (+) Family history of anesthesia reactionNegative for: history of anesthetic complications  Airway Mallampati: III  TM Distance: >3 FB     Dental no notable dental hx.    Pulmonary neg COPD, neg PE   breath sounds clear to auscultation       Cardiovascular Exercise Tolerance: Good (-) hypertension(-) angina (-) CAD, (-) Past MI and (-) Cardiac Stents (-) dysrhythmias (-) pacemaker Rhythm:Regular Rate:Normal     Neuro/Psych neg Seizures    GI/Hepatic PUD,,,  Endo/Other    Renal/GU      Musculoskeletal  (+) Arthritis , Osteoarthritis,    Abdominal   Peds  Hematology CVID   Anesthesia Other Findings   Reproductive/Obstetrics                              Anesthesia Physical Anesthesia Plan  ASA: 2  Anesthesia Plan: MAC   Post-op Pain Management:    Induction: Intravenous  PONV Risk Score and Plan: 1 and Ondansetron  and Propofol  infusion  Airway Management Planned: Natural Airway and Nasal Cannula  Additional Equipment:   Intra-op Plan:   Post-operative Plan:   Informed Consent: I have reviewed the patients History and Physical, chart, labs and discussed the procedure including the risks, benefits and alternatives for the proposed anesthesia with the patient or authorized representative who has indicated his/her understanding and acceptance.     Dental advisory given  Plan Discussed with: CRNA  Anesthesia Plan Comments:          Anesthesia Quick Evaluation

## 2023-05-20 ENCOUNTER — Encounter (HOSPITAL_COMMUNITY): Payer: Self-pay | Admitting: Internal Medicine

## 2023-09-09 DIAGNOSIS — I10 Essential (primary) hypertension: Secondary | ICD-10-CM | POA: Diagnosis not present

## 2023-10-06 DIAGNOSIS — J189 Pneumonia, unspecified organism: Secondary | ICD-10-CM | POA: Diagnosis not present

## 2023-10-06 DIAGNOSIS — R058 Other specified cough: Secondary | ICD-10-CM | POA: Diagnosis not present

## 2023-10-06 DIAGNOSIS — R0981 Nasal congestion: Secondary | ICD-10-CM | POA: Diagnosis not present

## 2023-10-11 DIAGNOSIS — D839 Common variable immunodeficiency, unspecified: Secondary | ICD-10-CM | POA: Diagnosis not present

## 2023-10-11 DIAGNOSIS — M4690 Unspecified inflammatory spondylopathy, site unspecified: Secondary | ICD-10-CM | POA: Diagnosis not present

## 2023-10-17 DIAGNOSIS — R058 Other specified cough: Secondary | ICD-10-CM | POA: Diagnosis not present

## 2023-10-17 DIAGNOSIS — J189 Pneumonia, unspecified organism: Secondary | ICD-10-CM | POA: Diagnosis not present

## 2023-11-21 DIAGNOSIS — D849 Immunodeficiency, unspecified: Secondary | ICD-10-CM | POA: Diagnosis not present

## 2023-11-21 DIAGNOSIS — R7301 Impaired fasting glucose: Secondary | ICD-10-CM | POA: Diagnosis not present

## 2023-11-21 DIAGNOSIS — I10 Essential (primary) hypertension: Secondary | ICD-10-CM | POA: Diagnosis not present

## 2023-11-21 DIAGNOSIS — Z125 Encounter for screening for malignant neoplasm of prostate: Secondary | ICD-10-CM | POA: Diagnosis not present

## 2023-11-30 DIAGNOSIS — Z23 Encounter for immunization: Secondary | ICD-10-CM | POA: Diagnosis not present

## 2023-11-30 DIAGNOSIS — Z0000 Encounter for general adult medical examination without abnormal findings: Secondary | ICD-10-CM | POA: Diagnosis not present

## 2023-11-30 DIAGNOSIS — Z1339 Encounter for screening examination for other mental health and behavioral disorders: Secondary | ICD-10-CM | POA: Diagnosis not present

## 2023-11-30 DIAGNOSIS — R82998 Other abnormal findings in urine: Secondary | ICD-10-CM | POA: Diagnosis not present

## 2023-11-30 DIAGNOSIS — Z1331 Encounter for screening for depression: Secondary | ICD-10-CM | POA: Diagnosis not present

## 2023-11-30 DIAGNOSIS — I10 Essential (primary) hypertension: Secondary | ICD-10-CM | POA: Diagnosis not present
